# Patient Record
Sex: Male | Born: 1975 | Hispanic: No | Marital: Single | Smoking: Never smoker
Health system: Southern US, Community
[De-identification: ages and names within clinical notes are randomized; demographics above are authoritative.]

## PROBLEM LIST (undated history)

## (undated) DIAGNOSIS — B20 Human immunodeficiency virus [HIV] disease: Secondary | ICD-10-CM

---

## 2010-04-20 ENCOUNTER — Ambulatory Visit: Payer: Self-pay | Admitting: Adult Health

## 2010-04-20 DIAGNOSIS — K589 Irritable bowel syndrome without diarrhea: Secondary | ICD-10-CM

## 2010-04-20 DIAGNOSIS — D649 Anemia, unspecified: Secondary | ICD-10-CM

## 2010-04-20 DIAGNOSIS — B20 Human immunodeficiency virus [HIV] disease: Secondary | ICD-10-CM

## 2010-04-20 LAB — CONVERTED CEMR LAB
Alkaline Phosphatase: 77 units/L (ref 39–117)
BUN: 12 mg/dL (ref 6–23)
Bacteria, UA: NONE SEEN
Basophils Relative: 1 % (ref 0–1)
Bilirubin Urine: NEGATIVE
CO2: 25 meq/L (ref 19–32)
Creatinine, Ser: 1.07 mg/dL (ref 0.40–1.50)
Crystals: NONE SEEN
Eosinophils Absolute: 0.1 10*3/uL (ref 0.0–0.7)
Eosinophils Relative: 2 % (ref 0–5)
Glucose, Bld: 90 mg/dL (ref 70–99)
HCT: 35.7 % — ABNORMAL LOW (ref 39.0–52.0)
Hemoglobin: 11.8 g/dL — ABNORMAL LOW (ref 13.0–17.0)
Hep B S Ab: POSITIVE — AB
Hepatitis B Surface Ag: NEGATIVE
Ketones, ur: NEGATIVE mg/dL
LDL Cholesterol: 78 mg/dL (ref 0–99)
Lymphs Abs: 2.8 10*3/uL (ref 0.7–4.0)
MCHC: 33.1 g/dL (ref 30.0–36.0)
MCV: 87.1 fL (ref 78.0–100.0)
Monocytes Absolute: 0.6 10*3/uL (ref 0.1–1.0)
Monocytes Relative: 11 % (ref 3–12)
Neutrophils Relative %: 35 % — ABNORMAL LOW (ref 43–77)
Protein, ur: >300 mg/dL — AB
RBC: 4.1 M/uL — ABNORMAL LOW (ref 4.22–5.81)
Specific Gravity, Urine: 1.028 (ref 1.005–1.030)
Total Bilirubin: 0.3 mg/dL (ref 0.3–1.2)
Triglycerides: 106 mg/dL (ref <150)
Urine Glucose: NEGATIVE mg/dL
Urobilinogen, UA: 1 (ref 0.0–1.0)
VLDL: 21 mg/dL (ref 0–40)
WBC: 5.4 10*3/uL (ref 4.0–10.5)

## 2010-04-21 ENCOUNTER — Telehealth: Payer: Self-pay

## 2010-04-22 ENCOUNTER — Ambulatory Visit: Payer: Self-pay | Admitting: Adult Health

## 2010-04-22 DIAGNOSIS — R799 Abnormal finding of blood chemistry, unspecified: Secondary | ICD-10-CM

## 2010-04-22 DIAGNOSIS — R319 Hematuria, unspecified: Secondary | ICD-10-CM

## 2010-04-22 LAB — CONVERTED CEMR LAB
Albumin ELP: 29.3 % — ABNORMAL LOW (ref 55.8–66.1)
Alkaline Phosphatase: 76 units/L (ref 39–117)
Alpha-1-Globulin: 2.8 % — ABNORMAL LOW (ref 2.9–4.9)
BUN: 10 mg/dL (ref 6–23)
Beta Globulin: 4.6 % — ABNORMAL LOW (ref 4.7–7.2)
Bilirubin Urine: NEGATIVE
Gamma Globulin: 50.3 % — ABNORMAL HIGH (ref 11.1–18.8)
Glucose, Bld: 82 mg/dL (ref 70–99)
Leukocytes, UA: NEGATIVE
Sodium: 135 meq/L (ref 135–145)
Total Bilirubin: 0.3 mg/dL (ref 0.3–1.2)
Urine Glucose: NEGATIVE mg/dL
pH: 6 (ref 5.0–8.0)

## 2010-04-23 ENCOUNTER — Encounter: Payer: Self-pay | Admitting: Adult Health

## 2010-04-28 ENCOUNTER — Telehealth: Payer: Self-pay

## 2010-05-10 ENCOUNTER — Ambulatory Visit: Payer: Self-pay | Admitting: Adult Health

## 2010-05-10 DIAGNOSIS — F1910 Other psychoactive substance abuse, uncomplicated: Secondary | ICD-10-CM

## 2010-05-10 DIAGNOSIS — F341 Dysthymic disorder: Secondary | ICD-10-CM

## 2010-05-24 ENCOUNTER — Ambulatory Visit: Payer: Self-pay | Admitting: Adult Health

## 2010-05-24 ENCOUNTER — Encounter (INDEPENDENT_AMBULATORY_CARE_PROVIDER_SITE_OTHER): Payer: Self-pay | Admitting: *Deleted

## 2010-05-24 DIAGNOSIS — F528 Other sexual dysfunction not due to a substance or known physiological condition: Secondary | ICD-10-CM

## 2010-05-26 ENCOUNTER — Encounter (INDEPENDENT_AMBULATORY_CARE_PROVIDER_SITE_OTHER): Payer: Self-pay | Admitting: *Deleted

## 2010-06-08 ENCOUNTER — Ambulatory Visit: Payer: Self-pay | Admitting: Adult Health

## 2010-06-10 ENCOUNTER — Encounter: Payer: Self-pay | Admitting: Adult Health

## 2010-06-10 LAB — CONVERTED CEMR LAB
ALT: 14 units/L (ref 0–53)
BUN: 12 mg/dL (ref 6–23)
Bilirubin Urine: NEGATIVE
CO2: 26 meq/L (ref 19–32)
Calcium: 8.8 mg/dL (ref 8.4–10.5)
Chloride: 103 meq/L (ref 96–112)
Creatinine, Ser: 0.95 mg/dL (ref 0.40–1.50)
Crystals: NONE SEEN
Eosinophils Relative: 5 % (ref 0–5)
Glucose, Bld: 88 mg/dL (ref 70–99)
HCT: 33.8 % — ABNORMAL LOW (ref 39.0–52.0)
HIV 1 RNA Quant: 358 copies/mL — ABNORMAL HIGH (ref <20)
Hemoglobin: 11 g/dL — ABNORMAL LOW (ref 13.0–17.0)
Leukocytes, UA: NEGATIVE
Lymphocytes Relative: 46 % (ref 12–46)
Lymphs Abs: 2.1 10*3/uL (ref 0.7–4.0)
Nitrite: NEGATIVE
Platelets: 342 10*3/uL (ref 150–400)
Specific Gravity, Urine: 1.021 (ref 1.005–1.030)
Squamous Epithelial / LPF: NONE SEEN /lpf
Urobilinogen, UA: 0.2 (ref 0.0–1.0)
WBC: 4.5 10*3/uL (ref 4.0–10.5)

## 2010-06-22 ENCOUNTER — Encounter (INDEPENDENT_AMBULATORY_CARE_PROVIDER_SITE_OTHER): Payer: Self-pay | Admitting: *Deleted

## 2010-06-24 ENCOUNTER — Ambulatory Visit
Admission: RE | Admit: 2010-06-24 | Discharge: 2010-06-24 | Payer: Self-pay | Source: Home / Self Care | Attending: Adult Health | Admitting: Adult Health
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
## 2010-07-13 NOTE — Assessment & Plan Note (Signed)
Summary: Nurse Visit (Infectious Disease)   Infectious Disease New Patient Intake Referring MD/Agency: Laurette Schimke Address: [facility]  [address]  [place], [place] [address]  Return Appointment Date: 05/04/2010  With Physician: Traci Sermon, NP  Medical Records: Requested Health Insurance / Payor: No Insurance Employer: None    Does insurance cover prescriptions? Yes Our patient has been informed that medication assistance programs are available.  Our Co-ordinator will be meeting with the patient during this visit to discuss financial and medication assistance.   Do you have a Primary physician: No Are family members aware of patient's diagnosis?  If so, are they supportive? None,  Describe patient's current social support (family, friends, support groups): Support from friends who are also HIV Positive  Medical History Medication Allergies: No   Medications: No    Family History Hypertension:   Family Side: Maternal Diabetes:   Family Side: Maternal  Tobacco use: current Amt: 1/2 packs per day.  Behavioral Health Assessment Have you ever been diagnosed with depression or mental illness? Yes  Do you drink alcohol? Yes Alcohol Beverage Type(s): white wine  Frequency: 1-2 times weekly  Drugs: Past history of polysubstance abuse cocaine, crystal meth. sniffed chemicals  Do you feel you have a problem with drugs and/or alcohol? No   Describe: Past history noted Have you ever been in a treatment facility for any addiction? No Behavioral Health Comments: Pt was on drugs for 5 or more years   HIV Intake Information When did you first test positive for HIV? 11/23/2009 Type of test Conducted: Western Blot   Where was this test performed?  Name of Agency: [facility]  City/State: [place]  Was this your first time ever being tested or HIV? No   LAST negative HIV test result: 2-11 Name of Office: unknown    Risk Factor(s) for HIV: MSM  Method  of Exposure to HIV: Homosexual Intercourse-Insertive Have you ever been hospitalized for any HIV-related condition? No  Have you ever been under the care of a physician for being HIV positive? No  Newly Diagnosed Patients Has a Disease Intervention Specialist from the Health Department contacted the patient? Yes.   The patient has been informed that the [facility] Department will contact ALL newly reported cases. Health Department Contact:  [phone]   (SSN is needed for confirmation)  Reported Today: 04/20/2010 Health Department Contact:  [phone]            (SSN is needed for confirmation)  Person Reporting: previously reported /tkk Do you have any Non-HIV related medical conditions or other prior hospitalizations or surgeries? No  HIV Medications Information The patient is currently NOT taking any HIV medications.  Infection History  Patient has been diagnosed with the following opportunistic infections: Are there any other symptoms you need to discuss? No Have you received literature/education prior to this visit about HIV/AIDS? Yes Do you understand the meaning of a Viral Load? No Do you understand the meaning of a CD4 count? No Lab Values Education/Handout Given Yes Medication Education/Handout Given Yes  Sexual History Are you in a current relationship? No Details: Peviousl relationship  of 5 years recently ended. Partner was positive.  Are you currently sexually active? 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  Lifetime partners estimated at less than 50 with occasional condom use.   Last  6months 5 male partners anal and oral sex Last year 34 male partners  Evaluation and Follow-Up INTAKE CHECK LIST: HIV Education, Safe Sex Counseling, Case Management Referral, HIV Material Given  Prevention For Positives: 04/20/2010    Safe sex practices discussed with patient. Condoms offered. Are you involved in any social organization? [facility] Does patient have problems that warrant Social Worker referral? No  Immunization History:  Influenza Immunization History:    Influenza:  historical (11/25/2009)  Immunizations Administered:  Pneumonia Vaccine:    Vaccine Type: Pneumovax    Site: right deltoid    Mfr: Merck    Dose: 0.5 ml    Route: IM    Given by: Tomasita Morrow RN    Exp. Date: 10/13/2011    Lot #: [id number]    VIS given: 05/18/09 version given April 20, 2010.  PPD Results    Date of reading: 11/25/2009    Results: < 5mm    Interpretation: negative            Prevention For Positives: 04/20/2010   Safe sex practices discussed with patient. Condoms offered.

## 2010-07-13 NOTE — Progress Notes (Signed)
Summary: Headaches  Phone Note Call from Patient   Complaint: Breathing Problems Summary of Call: Pt his having headaches a few hours after taking the Bactrim.   Headaches started after starting the Bactrim.  Today he purposely did not take the med and he has not had the headache.  Please advise.   Initial call taken by: Tomasita Morrow RN,  April 28, 2010 4:11 PM  Follow-up for Phone Call        Pt states  the headaches stopped. Tomasita Morrow RN  May 05, 2010 5:40 PM

## 2010-07-13 NOTE — Miscellaneous (Signed)
Summary: HIPAA Restrictions  HIPAA Restrictions   Imported By: Florinda Marker 04/26/2010 16:05:32  _____________________________________________________________________  External Attachment:    Type:   Image     Comment:   External Document

## 2010-07-13 NOTE — Progress Notes (Signed)
Summary: Return for OV and repeat labs/tkk  Phone Note Outgoing Call   Call placed by: Tomasita Morrow RN,  April 21, 2010 3:18 PM Summary of Call: Patient called for return OV to repeat urinalysis and blood work per [employer].    Pt will see Abbie Sons  prior to labs. Tomasita Morrow RN  April 21, 2010 3:19 PM Appt. scheduled. Tomasita Morrow RN  April 21, 2010 3:19 PM

## 2010-07-15 NOTE — Miscellaneous (Signed)
Summary: ADAP update   Clinical Lists Changes  Observations: Added new observation of AIDSDAP: Yes 2011 (05/26/2010 9:56)
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
## 2010-07-15 NOTE — Miscellaneous (Signed)
Summary: RW Financial Update  Clinical Lists Changes  Observations: Added new observation of YEARLYEXPEN: 1148  (05/24/2010 12:14) Added new observation of RWTITLE: B  (05/24/2010 12:14) Added new observation of AIDSDAP: Pending - applied  (05/24/2010 12:14) Added new observation of PCTFPL: 21.73  (05/24/2010 12:14) Added new observation of INCOMESOURCE: worked thr [employer]   currently unemployed   (05/24/2010 12:14) Added new observation of HOUSEINCOME: 2353  (05/24/2010 12:14) Added new observation of #CHILD<18 IN: No  (05/24/2010 12:14) Added new observation of FAMILYSIZE: 1  (05/24/2010 12:14) Added new observation of HOUSING: Temporary  (05/24/2010 12:14) Added new observation of FINASSESSDT: 05/24/2010  (05/24/2010 12:14) Added new observation of RWPARTICIP: Yes  (05/24/2010 12:14)

## 2010-07-15 NOTE — Assessment & Plan Note (Signed)
Summary: 2WKS F/U [MKJ]   CC:  f/u.  History of Present Illness: Doing well, adherent with meds, no physical coimplaints.  Did not order refills as directed, but received call from [facility] requesting whether he needs refills.   Current Allergies (reviewed today): No known allergies  Review of Systems  The patient denies anorexia, fever, weight loss, weight gain, vision loss, decreased hearing, hoarseness, chest pain, syncope, dyspnea on exertion, peripheral edema, prolonged cough, headaches, hemoptysis, abdominal pain, melena, hematochezia, severe indigestion/heartburn, hematuria, incontinence, genital sores, muscle weakness, suspicious skin lesions, transient blindness, difficulty walking, depression, unusual weight change, abnormal bleeding, enlarged lymph nodes, angioedema, breast masses, and testicular masses.    Vital Signs:  Patient profile:   35 year old male Height:      71 inches (180.34 cm) Weight:      168.75 pounds (76.70 kg) BMI:     23.62 Temp:     97.9 degrees F (36.61 degrees C) oral Pulse rate:   65 / minute BP sitting:   110 / 75  (left arm)  Vitals Entered By: Starleen Arms CMA (June 24, 2010 9:42 AM) CC: f/u Is Patient Diabetic? No Pain Assessment Patient in pain? no      Nutritional Status BMI of 19 -24 = normal Nutritional Status Detail nl  Does patient need assistance? Functional Status Self care Ambulation Normal   Physical Exam  General:  Well-developed,well-nourished,in no acute distress; alert,appropriate and cooperative throughout examination Head:  Normocephalic and atraumatic without obvious abnormalities. No apparent alopecia or balding. Eyes:  No corneal or conjunctival inflammation noted. EOMI. Perrla. Vision grossly normal. Ears:  External ear exam shows no significant lesions or deformities.  Otoscopic examination reveals clear canals, tympanic membranes are intact bilaterally without bulging, retraction, inflammation or  discharge. Hearing is grossly normal bilaterally. Mouth:  Oral mucosa and oropharynx without lesions or exudates.  Teeth in good repair. Neck:  No deformities, masses, or tenderness noted. Lungs:  Normal respiratory effort, chest expands symmetrically. Lungs are clear to auscultation, no crackles or wheezes. Heart:  Normal rate and regular rhythm. S1 and S2 normal without gallop, murmur, click, rub or other extra sounds. Abdomen:  Bowel sounds positive,abdomen soft and non-tender without masses, organomegaly or hernias noted. Msk:  No deformity or scoliosis noted of thoracic or lumbar spine.   Pulses:  R and L carotid,radial,femoral,dorsalis pedis and posterior tibial pulses are full and equal bilaterally Extremities:  No clubbing, cyanosis, edema, or deformity noted with normal full range of motion of all joints.   Neurologic:  No cranial nerve deficits noted. Station and gait are normal. Sensory, motor and coordinative functions appear intact. Skin:  Intact without suspicious lesions or rashes Cervical Nodes:  Shoddy A&P Cx LN's Axillary Nodes:  Shoddy enlarged axillary LN's Psych:  Cognition and judgment appear intact. Alert and cooperative with normal attention span and concentration. No apparent delusions, illusions, hallucinations   Impression & Recommendations:  Problem # 1:  HIV INFECTION (ICD-042)  CD4 380 @ 17% which is up from 270 but an increase of 6% after 1 month therapy.  HIV RNA  is 358 copies/ml per PCR which is a 2.58 log drop from baseline of 134,000 copies.  Good nresponse after 1 month therapy.  Provided him with this encouraging news and he recommitted to adherence with his meds.  Confirmed he will follow-through with his refills today.  We will plan on repeat labs in 6 weeks with f/u in 8 weeks.  Acknowledged this and agreed with  plan of care. His updated medication list for this problem includes:    Smz-tmp Ds 800-160 Mg Tabs (Sulfamethoxazole-trimethoprim) .Marland Kitchen... 1  tablet by mouth once daily  Orders: Est. Patient Level III (99213)Future Orders: T-CBC w/Diff ([id number]) ... 08/05/2010 T-CD4SP ([facility]) (CD4SP) ... 08/05/2010 T-Comprehensive Metabolic Panel [id number]) ... 08/05/2010 T-HIV Viral Load [id number]) ... 08/05/2010  Problem # 2:  ERECTILE DYSFUNCTION, NON-ORGANIC (ICD-302.72) Assessment: Improved  His updated medication list for this problem includes:    Cialis 5 Mg Tabs (Tadalafil) .Marland Kitchen... 1 tablet by mouth once daily as directed  Problem # 3:  DYSTHYMIA, SITUATIONAL (ICD-300.4) Assessment: Improved

## 2010-07-15 NOTE — Miscellaneous (Signed)
  Clinical Lists Changes 

## 2010-07-15 NOTE — Assessment & Plan Note (Signed)
Summary: Office Visit - Infectious Disease   History of Present Illness: Returned for re-evaluation after starting PCP prophylaxis.  Relates during the holiday weekend he used crack cocaine and now is feeling severely depressed over diagnosis and his health.  Feels as though his life is "falling apart" and does not know if he can "survive" this.  In spite of his mood, he wants to start therapy for HIV.  Admits adherence to prophylaxis meds without any problems.  Denies rash or fever.   Current Allergies: No known allergies  Review of Systems General:  Complains of loss of appetite and sleep disorder; denies chills, fatigue, fever, malaise, sweats, weakness, and weight loss. ENT:  Denies decreased hearing, difficulty swallowing, ear discharge, earache, hoarseness, nasal congestion, nosebleeds, postnasal drainage, ringing in ears, sinus pressure, and sore throat. CV:  Denies bluish discoloration of lips or nails, chest pain or discomfort, difficulty breathing at night, difficulty breathing while lying down, fainting, fatigue, leg cramps with exertion, lightheadness, near fainting, palpitations, shortness of breath with exertion, swelling of feet, swelling of hands, and weight gain. Resp:  Denies chest discomfort, chest pain with inspiration, cough, coughing up blood, excessive snoring, hypersomnolence, morning headaches, pleuritic, shortness of breath, sputum productive, and wheezing. GI:  Complains of loss of appetite; denies abdominal pain, bloody stools, change in bowel habits, constipation, dark tarry stools, diarrhea, excessive appetite, gas, hemorrhoids, indigestion, nausea, vomiting, vomiting blood, and yellowish skin color. GU:  Complains of erectile dysfunction; denies decreased libido, discharge, dysuria, genital sores, hematuria, incontinence, nocturia, urinary frequency, and urinary hesitancy. MS:  Denies joint pain, joint redness, joint swelling, loss of strength, low back pain, mid back  pain, muscle aches, muscle , cramps, muscle weakness, stiffness, and thoracic pain. Derm:  Denies changes in color of skin, changes in nail beds, dryness, excessive perspiration, flushing, hair loss, insect bite(s), itching, lesion(s), poor wound healing, and rash. Neuro:  Denies brief paralysis, difficulty with concentration, disturbances in coordination, falling down, headaches, inability to speak, memory loss, numbness, poor balance, seizures, sensation of room spinning, tingling, tremors, visual disturbances, and weakness. Psych:  Complains of anxiety, depression, and easily tearful; denies alternate hallucination ( auditory/visual), easily angered, irritability, mental problems, panic attacks, sense of great danger, suicidal thoughts/plans, thoughts of violence, unusual visions or sounds, and thoughts /plans of harming others.  Physical Exam  General:  alert, well-developed, well-nourished, well-hydrated, disheveled, and unkempt.   Head:  Normocephalic and atraumatic without obvious abnormalities. No apparent alopecia or balding. Eyes:  No corneal or conjunctival inflammation noted. EOMI. Perrla. Ears:  External ear exam shows no significant lesions or deformities.  Otoscopic examination reveals clear canals, tympanic membranes are intact bilaterally without bulging, retraction, inflammation or discharge. Hearing is grossly normal bilaterally. Mouth:  Oral mucosa and oropharynx without lesions or exudates.  Teeth in good repair. Neck:  No deformities, masses, or tenderness noted. Lungs:  Normal respiratory effort, chest expands symmetrically. Lungs are clear to auscultation, no crackles or wheezes. Heart:  Tachycardic but regular rhythm. S1 and S2 normal without gallop, murmur, click, rub or other extra sounds.   Abdomen:  Bowel sounds positive,abdomen soft and non-tender without masses, organomegaly or hernias noted. Genitalia:  Testes bilaterally descended without nodularity, tenderness or  masses. No scrotal masses or lesions. No penis lesions or urethral discharge. Msk:  No deformity or scoliosis noted of thoracic or lumbar spine.   Pulses:  R and L carotid,radial,femoral,dorsalis pedis and posterior tibial pulses are full and equal bilaterally Extremities:  No clubbing, cyanosis,  edema, or deformity noted with normal full range of motion of all joints.   Neurologic:  No cranial nerve deficits noted. Station and gait are normal. Plantar reflexes are down-going bilaterally. DTRs are symmetrical throughout. Sensory, motor and coordinative functions appear intact. Skin:  Mild follicular rash on face, otherwise clear, no rashes or lesions Cervical Nodes:  Shoddy A&P Cx LN's as well as shoddy enlarged submandibular LN's bil. Axillary Nodes:  Shoddy enlarged axillary LN's Inguinal Nodes:  Shoddy enlarged bil. inguinal LN's Psych:  Oriented X3, memory intact for recent and remote, depressed affect, poor eye contact, tearful, and moderately anxious.     Impression & Recommendations:  Problem # 1:  HIV INFECTION (ICD-042) In spite of his current state, he still remains adamant on starting ARV therapy.  We discussed the variety of treaatment options available, specific drug SE's, ADR's, and toxicities.  He strongly desires starting Atripla therapy.  As his ADAP is not complete, we will dispense a 30-day supply to start, have him complete his ADAP requirements, and have him return in 2 weeks for follow-up.  His updated medication list for this problem includes:    Smz-tmp Ds 800-160 Mg Tabs (Sulfamethoxazole-trimethoprim) .Marland Kitchen... 1 tablet by mouth once daily  Problem # 2:  DYSTHYMIA, SITUATIONAL (ICD-300.4) Current emotional state may be associated with recent drug use.  We will have the [facility] counselor speak with him today.  This will require further monitoring, but hopefully this is transient.  No medicine intervention at present unless symptoms continue.  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  States current relapse was "stupid" and sees outcome of his drug use.  This will require ongoing monitoring for relapse.  Rehab should still be offered at each opportunity, especially if he cointinues t relapse.  Will re-address on f/u  Medications Added to Medication List This Visit: 1)  Atripla 600-200-300 Mg Tabs (Efavirenz-emtricitab-tenofovir) .... Take one (1) tablet once a day at bedtime on empty stomach Prescriptions: ATRIPLA 600-200-300 MG TABS (EFAVIRENZ-EMTRICITAB-TENOFOVIR) Take one (1) tablet once a day at bedtime on empty stomach  #30 x 5   Entered and Authorized by:   Talmadge Chad NP   Signed by:   Talmadge Chad NP on 05/24/2010   Method used:   Print then Give to Patient   RxID:   [id number]

## 2010-07-15 NOTE — Miscellaneous (Signed)
  Clinical Lists Changes   Orders: Added new Service order of Est. Patient Level III (99213) - Signed 

## 2010-07-15 NOTE — Assessment & Plan Note (Signed)
Summary: 2WK F/U [MKJ]   CC:  follow-up visit.  History of Present Illness: Feeling much better.  Adherent to ARV therapy without any missed doses.  Denies any neurologic symptoms, vivid dreams, or balance/coordination problems.  Took Atripla once with food, but did not have any untoward effects.  Has not taken any drugs since last visit.  Does have c/o erectile dysfunction and requesting Rx for ED.  Preventive Screening-Counseling & Management  Alcohol-Tobacco     Alcohol drinks/day: occasional     Smoking Status: never  Caffeine-Diet-Exercise     Caffeine use/day: coffee and diet soda 3 per week     Does Patient Exercise: no  Hep-HIV-STD-Contraception     HIV Risk: no risk noted  Safety-Violence-Falls     Seat Belt Use: yes  Review of Systems  The patient denies anorexia, fever, weight loss, weight gain, vision loss, decreased hearing, hoarseness, chest pain, syncope, dyspnea on exertion, peripheral edema, prolonged cough, headaches, hemoptysis, abdominal pain, melena, hematochezia, severe indigestion/heartburn, hematuria, incontinence, genital sores, muscle weakness, suspicious skin lesions, transient blindness, difficulty walking, depression, unusual weight change, abnormal bleeding, enlarged lymph nodes, angioedema, breast masses, and testicular masses.    Vital Signs:  Patient profile:   35 year old male Height:      71 inches (180.34 cm) Weight:      164.0 pounds (74.55 kg) BMI:     22.96 Temp:     97.9 degrees F (36.61 degrees C) oral Pulse rate:   68 / minute BP sitting:   142 / 92  (left arm) Cuff size:   regular  Vitals Entered By: Jennet Maduro RN (May 24, 2010 11:06 AM) CC: follow-up visit Is Patient Diabetic? No Pain Assessment Patient in pain? no      Nutritional Status BMI of 19 -24 = normal  Have you ever been in a relationship where you felt threatened, hurt or afraid?not asked todya   Does patient need assistance? Functional Status Self  care Ambulation Normal   Physical Exam  General:  Well-developed,well-nourished,in no acute distress; alert,appropriate and cooperative throughout examination Head:  Normocephalic and atraumatic without obvious abnormalities. No apparent alopecia or balding. Eyes:  No corneal or conjunctival inflammation noted. EOMI. Perrla. Ears:  External ear exam shows no significant lesions or deformities.  Otoscopic examination reveals clear canals, tympanic membranes are intact bilaterally without bulging, retraction, inflammation or discharge. Hearing is grossly normal bilaterally. Mouth:  Oral mucosa and oropharynx without lesions or exudates.  Teeth in good repair. Neck:  No deformities, masses, or tenderness noted. Lungs:  Normal respiratory effort, chest expands symmetrically. Lungs are clear to auscultation, no crackles or wheezes. Heart:  Normal rate and regular rhythm. S1 and S2 normal without gallop, murmur, click, rub or other extra sounds. Abdomen:  Bowel sounds positive,abdomen soft and non-tender without masses, organomegaly or hernias noted. Msk:  No deformity or scoliosis noted of thoracic or lumbar spine.   Extremities:  No clubbing, cyanosis, edema, or deformity noted with normal full range of motion of all joints.   Neurologic:  No cranial nerve deficits noted. Station and gait are normal. Plantar reflexes are down-going bilaterally. DTRs are symmetrical throughout. Sensory, motor and coordinative functions appear intact. Skin:  Intact without suspicious lesions or rashes Cervical Nodes:  Shoddy A&P Cx LN's as well as shoddy enlarged submandibular LN's bil. Axillary Nodes:  Shoddy enlarged axillary LN's Psych:  Cognition and judgment appear intact. Alert and cooperative with normal attention span and concentration. No apparent delusions, illusions,  hallucinations   Impression & Recommendations:  Problem # 1:  HIV INFECTION (ICD-042) Tolerating and adherent to ARV therapy and PCP  prophylaxis.  Will continue current regimen, have him return for labs in 2 weeks and f/u in 4 weeks. His updated medication list for this problem includes:    Smz-tmp Ds 800-160 Mg Tabs (Sulfamethoxazole-trimethoprim) .Marland Kitchen... 1 tablet by mouth once daily  Future Orders: T-CBC w/Diff ([id number]) ... 06/08/2010 T-CD4SP ([facility]) (CD4SP) ... 06/08/2010 T-Comprehensive Metabolic Panel [id number]) ... 06/08/2010 T-HIV Viral Load [id number]) ... 06/08/2010  Problem # 2:  DYSTHYMIA, SITUATIONAL (ICD-300.4) Much improved, appears well-engaged.  No vegetative signs noted.  Could have been residual effects of recent drug use.  Will continue to monitor.  Problem # 3:  DRUG ABUSE (ICD-305.90) No drug use for the past two weeks.  Mood apparently much better, but requires further monitoring and ongoing assessment.  He claims he does not need rehab program.  We will continue to offer himthis, especially if he relapses again.  Problem # 4:  ERECTILE DYSFUNCTION, NON-ORGANIC (ICD-302.72) Will give him trial of Cialis.  Instructed how to obtain on-line coupon from website.  Drug information regarding Cialis use provided as well.  Will f/u with response on RTC. His updated medication list for this problem includes:    Cialis 5 Mg Tabs (Tadalafil) .Marland Kitchen... 1 tablet by mouth once daily as directed  Medications Added to Medication List This Visit: 1)  Cialis 5 Mg Tabs (Tadalafil) .Marland Kitchen.. 1 tablet by mouth once daily as directed  Other Orders: Est. Patient Level III (57846) Future Orders: T-Urinalysis ([id number]) ... 06/08/2010 Prescriptions: CIALIS 5 MG TABS (TADALAFIL) 1 tablet by mouth once daily as directed  #30 x 0   Entered and Authorized by:   Talmadge Chad NP   Signed by:   Talmadge Chad NP on 05/24/2010   Method used:   Print then Give to Patient   RxID:   [id number]   Handout requested.

## 2010-07-15 NOTE — Assessment & Plan Note (Signed)
Summary: NEW 042/TKK   CC:  new patient and no appetite.  Preventive Screening-Counseling & Management  Alcohol-Tobacco     Alcohol drinks/day: occasional     Smoking Status: never     Packs/Day: 1/2   Current Allergies (reviewed today): No known allergies  Vital Signs:  Patient profile:   35 year old male Height:      71 inches (180.34 cm) Weight:      159 pounds (72.27 kg) BMI:     22.26 Temp:     97.6 degrees F (36.44 degrees C) oral BP sitting:   111 / 81  (left arm)  Vitals Entered By: Starleen Arms CMA (May 10, 2010 10:00 AM) CC: new patient, no appetite Is Patient Diabetic? No Pain Assessment Patient in pain? no      Nutritional Status nlBMI of 19 -24 = normal Nutritional Status Detail not eating well  Does patient need assistance? Functional Status Self care Ambulation Normal

## 2010-08-09 ENCOUNTER — Other Ambulatory Visit: Payer: Self-pay

## 2010-08-20 ENCOUNTER — Encounter: Payer: Self-pay | Admitting: Adult Health

## 2010-08-23 ENCOUNTER — Ambulatory Visit: Payer: Self-pay | Admitting: Adult Health

## 2010-08-23 LAB — T-HELPER CELL (CD4) - (RCID CLINIC ONLY): CD4 T Cell Abs: 380 uL — ABNORMAL LOW (ref 400–2700)

## 2010-08-24 LAB — T-HELPER CELL (CD4) - (RCID CLINIC ONLY): CD4 % Helper T Cell: 11 % — ABNORMAL LOW (ref 33–55)

## 2011-01-14 ENCOUNTER — Emergency Department (HOSPITAL_COMMUNITY): Payer: Self-pay

## 2011-01-14 ENCOUNTER — Inpatient Hospital Stay (HOSPITAL_COMMUNITY)
Admission: EM | Admit: 2011-01-14 | Discharge: 2011-01-17 | DRG: 313 | Disposition: A | Discharge status: Home / Self Care | Payer: Self-pay | Attending: Internal Medicine | Admitting: Internal Medicine

## 2011-01-14 DIAGNOSIS — F209 Schizophrenia, unspecified: Secondary | ICD-10-CM | POA: Diagnosis present

## 2011-01-14 DIAGNOSIS — R072 Precordial pain: Principal | ICD-10-CM | POA: Diagnosis present

## 2011-01-14 DIAGNOSIS — E860 Dehydration: Secondary | ICD-10-CM | POA: Diagnosis present

## 2011-01-14 DIAGNOSIS — N179 Acute kidney failure, unspecified: Secondary | ICD-10-CM | POA: Diagnosis present

## 2011-01-14 DIAGNOSIS — D649 Anemia, unspecified: Secondary | ICD-10-CM | POA: Diagnosis present

## 2011-01-14 DIAGNOSIS — Z79899 Other long term (current) drug therapy: Secondary | ICD-10-CM

## 2011-01-14 DIAGNOSIS — E876 Hypokalemia: Secondary | ICD-10-CM | POA: Diagnosis present

## 2011-01-14 DIAGNOSIS — F1410 Cocaine abuse, uncomplicated: Secondary | ICD-10-CM | POA: Diagnosis present

## 2011-01-14 DIAGNOSIS — B20 Human immunodeficiency virus [HIV] disease: Secondary | ICD-10-CM | POA: Diagnosis present

## 2011-01-14 DIAGNOSIS — R9431 Abnormal electrocardiogram [ECG] [EKG]: Secondary | ICD-10-CM | POA: Diagnosis present

## 2011-01-14 LAB — URINALYSIS, MICROSCOPIC ONLY
Glucose, UA: NEGATIVE mg/dL
Protein, ur: NEGATIVE mg/dL

## 2011-01-14 LAB — URINALYSIS, ROUTINE W REFLEX MICROSCOPIC
Nitrite: NEGATIVE
Protein, ur: 100 mg/dL — AB
Urobilinogen, UA: 1 mg/dL (ref 0.0–1.0)

## 2011-01-14 LAB — COMPREHENSIVE METABOLIC PANEL
ALT: 10 U/L (ref 0–53)
AST: 23 U/L (ref 0–37)
Alkaline Phosphatase: 69 U/L (ref 39–117)
CO2: 24 mEq/L (ref 19–32)
Calcium: 8.3 mg/dL — ABNORMAL LOW (ref 8.4–10.5)
Chloride: 106 mEq/L (ref 96–112)
GFR calc non Af Amer: >60 mL/min (ref >60)
Potassium: 4 mEq/L (ref 3.5–5.1)
Sodium: 135 mEq/L (ref 135–145)
Total Bilirubin: 0.7 mg/dL (ref 0.3–1.2)

## 2011-01-14 LAB — URINE MICROSCOPIC-ADD ON

## 2011-01-14 LAB — CK TOTAL AND CKMB (NOT AT ARMC)
CK, MB: 2.8 ng/mL (ref 0.3–4.0)
CK, MB: 3.3 ng/mL (ref 0.3–4.0)
Relative Index: 0.6 (ref 0.0–2.5)

## 2011-01-14 LAB — DIFFERENTIAL
Lymphocytes Relative: 22 % (ref 12–46)
Lymphs Abs: 1.8 10*3/uL (ref 0.7–4.0)
Monocytes Relative: 9 % (ref 3–12)
Neutrophils Relative %: 69 % (ref 43–77)

## 2011-01-14 LAB — BASIC METABOLIC PANEL
BUN: 23 mg/dL (ref 6–23)
CO2: 23 mEq/L (ref 19–32)
Calcium: 9.3 mg/dL (ref 8.4–10.5)
Chloride: 101 mEq/L (ref 96–112)
Creatinine, Ser: 1.65 mg/dL — ABNORMAL HIGH (ref 0.50–1.35)
Glucose, Bld: 93 mg/dL (ref 70–99)

## 2011-01-14 LAB — TROPONIN I: Troponin I: <0.3 ng/mL (ref <0.30)

## 2011-01-14 LAB — CBC
HCT: 34 % — ABNORMAL LOW (ref 39.0–52.0)
Hemoglobin: 11.3 g/dL — ABNORMAL LOW (ref 13.0–17.0)
MCH: 28.4 pg (ref 26.0–34.0)
MCV: 85.4 fL (ref 78.0–100.0)
RBC: 3.98 MIL/uL — ABNORMAL LOW (ref 4.22–5.81)
WBC: 8.5 10*3/uL (ref 4.0–10.5)

## 2011-01-14 LAB — APTT: aPTT: 37 seconds (ref 24–37)

## 2011-01-14 LAB — RAPID URINE DRUG SCREEN, HOSP PERFORMED
Benzodiazepines: NOT DETECTED
Cocaine: NOT DETECTED

## 2011-01-14 MED ORDER — IOHEXOL 300 MG/ML  SOLN
100.0000 mL | Freq: Once | INTRAMUSCULAR | Status: AC | PRN
  Administered 2011-01-14: 100 mL via INTRAVENOUS

## 2011-01-15 LAB — DIFFERENTIAL
Basophils Absolute: 0 10*3/uL (ref 0.0–0.1)
Lymphocytes Relative: 45 % (ref 12–46)
Neutro Abs: 2.1 10*3/uL (ref 1.7–7.7)
Neutrophils Relative %: 44 % (ref 43–77)

## 2011-01-15 LAB — CARDIAC PANEL(CRET KIN+CKTOT+MB+TROPI)
Relative Index: 0.5 (ref 0.0–2.5)
Troponin I: <0.3 ng/mL (ref <0.30)

## 2011-01-15 LAB — BASIC METABOLIC PANEL
BUN: 14 mg/dL (ref 6–23)
Chloride: 105 mEq/L (ref 96–112)
GFR calc non Af Amer: >60 mL/min (ref >60)
Glucose, Bld: 107 mg/dL — ABNORMAL HIGH (ref 70–99)
Potassium: 3.9 mEq/L (ref 3.5–5.1)

## 2011-01-15 LAB — CREATININE, URINE, RANDOM: Creatinine, Urine: 165.5 mg/dL

## 2011-01-15 LAB — LIPID PANEL
Cholesterol: 119 mg/dL (ref 0–200)
HDL: 38 mg/dL — ABNORMAL LOW (ref >39)
Total CHOL/HDL Ratio: 3.1 RATIO

## 2011-01-15 LAB — CBC
HCT: 28.8 % — ABNORMAL LOW (ref 39.0–52.0)
Hemoglobin: 9.6 g/dL — ABNORMAL LOW (ref 13.0–17.0)
RBC: 3.32 MIL/uL — ABNORMAL LOW (ref 4.22–5.81)
WBC: 4.8 10*3/uL (ref 4.0–10.5)

## 2011-01-16 ENCOUNTER — Ambulatory Visit (HOSPITAL_COMMUNITY)
Admit: 2011-01-16 | Discharge: 2011-01-16 | Disposition: A | Discharge status: Other Acute Inpatient Hospital | Payer: Self-pay | Source: Ambulatory Visit | Attending: Internal Medicine | Admitting: Internal Medicine

## 2011-01-16 ENCOUNTER — Encounter (HOSPITAL_COMMUNITY): Payer: Self-pay

## 2011-01-16 DIAGNOSIS — R079 Chest pain, unspecified: Secondary | ICD-10-CM | POA: Insufficient documentation

## 2011-01-16 LAB — BASIC METABOLIC PANEL
BUN: 7 mg/dL (ref 6–23)
CO2: 25 mEq/L (ref 19–32)
Chloride: 106 mEq/L (ref 96–112)
Creatinine, Ser: 0.77 mg/dL (ref 0.50–1.35)
Glucose, Bld: 89 mg/dL (ref 70–99)

## 2011-01-16 LAB — URINE CULTURE: Culture  Setup Time: 201208040213

## 2011-01-16 LAB — VITAMIN B12: Vitamin B-12: 406 pg/mL (ref 211–911)

## 2011-01-16 LAB — IRON AND TIBC
Iron: 82 ug/dL (ref 42–135)
TIBC: 232 ug/dL (ref 215–435)
UIBC: 150 ug/dL

## 2011-01-16 LAB — CBC
HCT: 30.2 % — ABNORMAL LOW (ref 39.0–52.0)
Hemoglobin: 9.7 g/dL — ABNORMAL LOW (ref 13.0–17.0)
MCV: 88.3 fL (ref 78.0–100.0)
RBC: 3.42 MIL/uL — ABNORMAL LOW (ref 4.22–5.81)
WBC: 4.2 10*3/uL (ref 4.0–10.5)

## 2011-01-16 MED ORDER — TECHNETIUM TC 99M TETROFOSMIN IV KIT
30.0000 | PACK | Freq: Once | INTRAVENOUS | Status: AC | PRN
  Administered 2011-01-16: 30 via INTRAVENOUS

## 2011-01-16 MED ORDER — TECHNETIUM TC 99M TETROFOSMIN IV KIT
10.0000 | PACK | Freq: Once | INTRAVENOUS | Status: AC | PRN
  Administered 2011-01-16: 10 via INTRAVENOUS

## 2011-01-17 LAB — BASIC METABOLIC PANEL
Calcium: 8.8 mg/dL (ref 8.4–10.5)
Chloride: 103 mEq/L (ref 96–112)
Creatinine, Ser: 0.91 mg/dL (ref 0.50–1.35)
GFR calc Af Amer: >60 mL/min (ref >60)

## 2011-01-17 LAB — T-HELPER CELLS (CD4) COUNT (NOT AT ARMC): CD4 T Cell Abs: 550 uL (ref 400–2700)

## 2011-01-17 LAB — CBC
MCV: 88.6 fL (ref 78.0–100.0)
Platelets: 267 10*3/uL (ref 150–400)
RDW: 13.7 % (ref 11.5–15.5)
WBC: 4.4 10*3/uL (ref 4.0–10.5)

## 2011-02-16 ENCOUNTER — Inpatient Hospital Stay (HOSPITAL_COMMUNITY): Admit: 2011-02-16 | Payer: Self-pay

## 2011-02-17 ENCOUNTER — Other Ambulatory Visit (HOSPITAL_COMMUNITY): Payer: Self-pay

## 2011-02-17 NOTE — Discharge Summary (Signed)
George Simon, George Simon               ACCOUNT NO.:  [id number]  MEDICAL RECORD NO.:  [id number]  LOCATION:  1444                         FACILITY:  [facility]  PHYSICIAN:  Ramiro Harvest, MD    DATE OF BIRTH:  03-Feb-1976  DATE OF ADMISSION:  01/14/2011 DATE OF DISCHARGE:  01/17/2011                        DISCHARGE SUMMARY    The patient's ID doctor is Dr. Nedra Hai at [facility].  DISCHARGE DIAGNOSES: 1. Chest pain/abnormal EKG, resolved. 2. Dehydration, resolved. 3. Acute renal failure, resolved. 4. Hypokalemia, repleted. 5. History of HIV with CD4 count pending. 6. History of schizophrenia. 7. Normocytic anemia.  DISCHARGE MEDICATIONS: 1. Etravirine 200 mg 2 tablets p.o. daily. 2. Norvir 100 mg p.o. daily. 3. Prezista 400 mg p.o. b.i.d. 4. Truvada 1 tablet p.o. daily.  DISPOSITION AND FOLLOWUP:  The patient will be discharged home.  The patient is to follow up with his PCP, which will be arranged per case manager in 1 to 2 weeks.  The patient is also to follow up with his ID doctor as previously scheduled.  CONSULTATIONS DONE:  A cardiology consultation was done.  The patient was seen in consultation by Dr. Herbie [place] of [facility] on January 14, 2011.  PROCEDURES PERFORMED: 1. Chest x-ray was done on January 14, 2011 that showed no acute     cardiopulmonary disease.  CT angiogram of the chest done, January 14, 2011, shows no evidence of PE.  A 2-D echo was done on January 16, 2011 that showed that overall evaluation revealed fairly normal     study within technical limits.  Chamber sizes were difficult to     assess, but measured, LA size was calculated to be mildly dilated.     Right atrium and right ventricular chambers appeared visually     enlarged, but may be overestimated.  Left ventricular thickness     does appear to be increased with normal chamber sizes noted.  LV     function suggest normal function, normal wall motion as well, EF of     55% to  60%.  A stress Myoview was done on January 16, 2011 with     preliminary results negative per cardiologist.  Formal results are     currently pending and will be followed up upon per PCP.  BRIEF ADMISSION HISTORY AND PHYSICAL:  [name] is a 35 year old African American gentleman with history of HIV.  Last CD4 count per the patient between 200 to 300.  The patient is on heart therapy, which was recently changed secondary to increased resistance.  The patient does also have a history of schizophrenia, who presented to the ED with chest pain.  The patient stated that he was walking to [facility] and started to feel dehydrated with a dry mouth, subsequently went into [employer] to get a drink.  He got the drink and sat down.  Eventually, he stood up and just did not feel good and had some lightheadedness and a presyncopal episode with some shortness of breath, palpitations, and diaphoresis.  The patient did experience some midsternal nonradiating chest pain, described as not sharp in nature and lasting  several minutes.  A lady at [facility] who was working at [employer] subsequently was nice to him, had him sit down, called EMS and he was brought to the ED. The patient did endorse some wheezing as well.  Denied any nausea or vomiting.  No fever, no chills, no abdominal pain, no cough, no diarrhea, no constipation.  No dysuria, no change in his chronic weakness.  First set of cardiac enzymes, which were ordered in the ED were negative.  EKG did show T-wave inversions in leads I, II, III, aVF, and V4 through V6.  The patient is currently chest pain free.  In the ED, he was given some aspirin.  We were called to admit the patient for further evaluation and management.  Per ED, PA Cardiology, [facility] and Vascular had been consulted and will see the patient.  For the rest of the admission history and physical, please see H and P dictated per Dr. Janee Morn of job [phone].  HOSPITAL  COURSE: 1. Chest pain/abnormal EKG.  The patient was admitted with chest pain.     EKG, which was done was abnormal with T-wave inversions in leads I,     II, III, aVF, V4 through V6.  The patient had no family history of     premature coronary artery disease.  He was admitted for chest pain,     rule out cardiac enzymes, which were cycled were negative x3.  A D-     dimer, which was obtained was slightly elevated at 0.5 and as such     a CT angiogram of the chest was done, which was negative for PE.     TSH which was obtained was within normal limits at 1.102.  The     patient was seen by Cardiology on January 14, 2011 and due to his EKG     changes, it was felt that the patient will benefit from a stress     test during this hospitalization.  The patient had a 2-D echo with     results as stated above, which had a normal EF with no wall motion     abnormalities.  A Myoview stress test was obtained on January 16, 2011 and preliminary results per cardiologist was negative.  The     patient remained chest pain free for the rest of the     hospitalization.  The patient will subsequently be discharged home     in stable and improved condition, to follow up with PCP and his ID     doctor as stated above.  On the day of discharge, vital signs,     temperature 98.5, pulse of 59, respirations 20, blood pressure     126/85, and saturating 100% on room air.  Hemoglobin A1c, which was     obtained came back at 5.1.  A fasting lipid panel, which was     obtained did have a total cholesterol of 119, triglycerides of 39,     HDL of 38, and LDL of 73. 2. Dehydration.  On admission, the patient was noted to be dehydrated.     The patient was hydrated with IV fluids and was euvolemic by day of     discharge. 3. Normocytic anemia.  During the hospitalization and admission, the     patient was noted to be anemic.  The patient did not have any overt     GI bleed.  His hemoglobin remained stable.  Anemia  panel, which was     obtained was consistent with normocytic anemia.  DISCHARGE LABORATORY DATA:  Sodium 137, potassium 3.6, chloride 103, bicarbonate 30, glucose 83, BUN 10, creatinine 0.91, and calcium of 8.8. CBC with a white count of 4.4, hemoglobin 10.6, hematocrit 32.6, and a platelet count of 267.  It was a pleasure taking care of Mr. Rex Oesterle.     Ramiro Harvest, MD     DT/MEDQ  D:  01/17/2011  T:  01/17/2011  Job:  [id number]  cc:   Dr. Nedra Hai, [place].  Landry Corporal, MD Fax: [phone]  Electronically Signed by Ramiro Harvest MD on 02/17/2011 12:07:19 PM

## 2011-02-17 NOTE — H&P (Signed)
George Simon               ACCOUNT NO.:  [id number]  MEDICAL RECORD NO.:  [id number]  LOCATION:  WLED                         FACILITY:  [facility]  PHYSICIAN:  Ramiro Harvest, MD    DATE OF BIRTH:  [birth year]  DATE OF ADMISSION:  01/14/2011 DATE OF DISCHARGE:                             HISTORY & PHYSICAL   CHIEF COMPLAINT:  Chest pain.  PRIMARY CARE PHYSICIAN:  Unassigned.  INFECTIOUS DISEASE DOCTOR:  Dr. Nedra Hai at [facility].  HISTORY OF PRESENT ILLNESS:  George Simon is a 35 year old African American gentleman with history of HIV.  Per patient, last CD-4 count was between 200 to 300.  He is on HAART therapy, which was recently changed secondary to increased resistance, history of schizophrenia, presented to the ED with chest pain.  The patient stated that he was walking to [employer] and started to feel dehydrated with dry mouth.  He subsequently went into [employer] to get a drink, got the drink and sat down.  Eventually, he stood up and just did not feel good, had some lightheadedness and a presyncopal episode with some shortness of breath and palpitations and diaphoresis.  The patient did experience some midsternal nonradiating chest pain described as a knot and sharp and lasting several minutes.  Lady at [employer] who was working at [employer] subsequently was nice to him, had him sit down, and called EMS and the patient was subsequently brought to the ED.  The patient does endorse some wheezing as well.  The patient denied any nausea or vomiting.  No fever, no chills, no abdominal pain, no cough, no diarrhea, no constipation, no dysuria, no change in his chronic weakness.  The patient was seen in the ED.  First set of cardiac enzymes, which ordered were negative.  EKG did show some abnormalities with T-wave inversions in leads I, II, III, aVF, V4 through V6.  The patient currently chest painfree, was given an aspirin, we were called to admit the patient for further evaluation and  management.  Per ED PA, Cardiology, [facility] has also been consulted and will see the patient.  ALLERGIES:  No known drug allergies.  PAST MEDICAL HISTORY: 1. History of HIV diagnosed in June 2012. 2. History of schizophrenia.  SOCIAL HISTORY:  No tobacco use.  Occasional binge alcohol use.  Some recreational drug use of cocaine, last use about 6 months ago.  FAMILY HISTORY:  Mother alive age 65 with a history of schizophrenia, diabetes, hypertension, hyperlipidemia.  Father alive age 22 on his third bout of colon cancer.  Also history of bronchitis and a history of hepatitis C.  REVIEW OF SYSTEMS:  As per HPI, otherwise negative.  PHYSICAL EXAMINATION:  VITAL SIGNS: Temperature 98.6, blood pressure 122/79, pulse of 98, respirations 16, saturating 100% on room air. GENERAL: The patient is well-developed, well-nourished gentleman, in no acute cardiopulmonary distress. HEENT: Normocephalic, atraumatic.  Pupils equal, round, reactive to light and accommodation.  Extraocular movements intact.  Oropharynx is clear.  No lesions, no exudates. NECK: Supple.  No lymphadenopathy. RESPIRATORY:  Lungs are clear to auscultation bilaterally.  No wheezes, no crackles, no rhonchi. CARDIOVASCULAR: Regular rhythm.  No murmurs, rubs, or gallops.  ABDOMEN: Soft, nontender, nondistended.  Positive bowel sounds. EXTREMITIES: No clubbing, cyanosis, or edema. NEUROLOGIC: The patient is alert and oriented x3.  Cranial nerves II-XII are grossly intact with no focal deficits.  ADMISSION LABORATORY DATA:  A UDS was positive for amphetamines, otherwise was negative.  UA was amber, cloudy, specific gravity 1.026, pH of 5.5, glucose negative, bilirubin moderate, ketones 40, blood large, protein 100, urobilinogen 1.0, nitrite negative, leukocytes small.  Urine microscopy; WBCs 11 to 20, RBCs 3 to 6, bacteria is many. First set of cardiac enzymes; CK 452, CK-MB 2.8, troponin-I of less  than 0.30.  BMET with a sodium of 134, potassium 3.1, chloride 101, bicarb 23, glucose 93, BUN 23, creatinine 1.65, and a calcium of 9.3.  CBC with a white count of 8.5, hemoglobin 11.3, hematocrit 34.0, platelet count of 298 with an ANC of 5.8.  A chest x-ray shows no acute cardiopulmonary disease.  EKG is showing T-wave inversions in leads I, II, III, aVF, V4 through V6.  ASSESSMENT AND PLAN:  [name] is a 35 year old gentleman with history of human immunodeficiency virus and schizophrenia, presented to the ED with shortness of breath and chest pain and feeling lightheadedness. 1. Chest pain/shortness of breath, questionable etiology concerning     for acute coronary syndrome.  The patient; however, has minimal     risk factors, has a history of human immunodeficiency virus, also     history of substance abuse of cocaine, no family history of     premature coronary artery disease.  No tobacco abuse.  EKG;     however, is concerning with T-wave inversions in leads I, II, III,     aVF, V4 through V6.  We will admit the patient to Telemetry.  Cycle     cardiac enzymes q.8 h x3.  Serial EKGs.  Check a 2-D echo to rule     out left ventricular dysfunction.  Check a TSH.  Check a D-dimer.     Check a fasting lipid panel.  We will place on morphine, aspirin,     oxygen, nitroglycerin, and low-dose beta-blocker.  Cardiology     consult is pending for further evaluation and recommendations.  We     will also place on prophylactic dose Lovenox for now until the     patient is evaluated by Cardiology. 2. Dehydration.  Hydrate with IV fluids. 3. Acute renal failure likely secondary to a prerenal azotemia     secondary to dehydration.  We will check a fractional excretion of     sodium.  Check orthostatics.  Repeat UA with cultures and     sensitivities.  Place on IV fluids and follow.  If no improvement     in renal function, we will check a renal ultrasound. 4. Hypokalemia.  Check a  magnesium level and replete. 5. History of human immunodeficiency virus.  Check a CD-4 count.     Resume home regimen of HAART therapy. 6. History of schizophrenia. 7. Prophylaxis.  Lovenox for DVT prophylaxis.  It has been a pleasure taking care of [name].     Ramiro Harvest, MD     DT/MEDQ  D:  01/14/2011  T:  01/14/2011  Job:  [id number]  cc:   Dr. Heide Guile  Electronically Signed by Ramiro Harvest MD on 02/17/2011 12:07:05 PM

## 2012-02-06 ENCOUNTER — Encounter (INDEPENDENT_AMBULATORY_CARE_PROVIDER_SITE_OTHER): Payer: Self-pay

## 2012-02-06 DIAGNOSIS — Z113 Encounter for screening for infections with a predominantly sexual mode of transmission: Secondary | ICD-10-CM

## 2012-02-06 NOTE — Progress Notes (Signed)
Pt requesting return to [facility] for treatment of HIV.  He was a previous patient of Traci Sermon, NP in 2011. Records have been received form [facility] from 2011-2013.     Appointment scheduled for labs and office visit.  Orders entered.   Amy P. Case Manager for [facility] is working with transitioning this patient.   She was given appointment information and was told pt would need to see Britta Mccreedy for financial eligibility prior to lab appointment.     Laurell Josephs, RN

## 2012-02-08 ENCOUNTER — Other Ambulatory Visit: Payer: Self-pay | Admitting: Internal Medicine

## 2012-02-08 DIAGNOSIS — Z113 Encounter for screening for infections with a predominantly sexual mode of transmission: Secondary | ICD-10-CM

## 2012-02-08 DIAGNOSIS — B20 Human immunodeficiency virus [HIV] disease: Secondary | ICD-10-CM

## 2012-02-08 DIAGNOSIS — Z79899 Other long term (current) drug therapy: Secondary | ICD-10-CM

## 2012-02-16 ENCOUNTER — Ambulatory Visit: Payer: Self-pay

## 2012-02-16 ENCOUNTER — Other Ambulatory Visit: Payer: Self-pay

## 2012-02-16 DIAGNOSIS — B20 Human immunodeficiency virus [HIV] disease: Secondary | ICD-10-CM

## 2012-02-16 LAB — CBC WITH DIFFERENTIAL/PLATELET
Basophils Absolute: 0.1 10*3/uL (ref 0.0–0.1)
Basophils Relative: 1 % (ref 0–1)
Eosinophils Absolute: 0.1 10*3/uL (ref 0.0–0.7)
MCH: 27.6 pg (ref 26.0–34.0)
MCHC: 32.5 g/dL (ref 30.0–36.0)
Neutrophils Relative %: 25 % — ABNORMAL LOW (ref 43–77)
Platelets: 301 10*3/uL (ref 150–400)
RBC: 4.5 MIL/uL (ref 4.22–5.81)
RDW: 14 % (ref 11.5–15.5)

## 2012-02-16 LAB — URINALYSIS
Glucose, UA: NEGATIVE mg/dL
Ketones, ur: NEGATIVE mg/dL
Leukocytes, UA: NEGATIVE
Nitrite: NEGATIVE
Protein, ur: NEGATIVE mg/dL

## 2012-02-16 LAB — HEPATITIS B SURFACE ANTIBODY,QUALITATIVE: Hep B S Ab: POSITIVE — AB

## 2012-02-16 LAB — COMPLETE METABOLIC PANEL WITH GFR
ALT: 12 U/L (ref 0–53)
Alkaline Phosphatase: 78 U/L (ref 39–117)
Sodium: 136 mEq/L (ref 135–145)
Total Bilirubin: 0.6 mg/dL (ref 0.3–1.2)
Total Protein: 9.8 g/dL — ABNORMAL HIGH (ref 6.0–8.3)

## 2012-02-16 LAB — LIPID PANEL
LDL Cholesterol: 88 mg/dL (ref 0–99)
Total CHOL/HDL Ratio: 3.7 Ratio

## 2012-02-16 LAB — HEPATITIS B SURFACE ANTIGEN: Hepatitis B Surface Ag: NEGATIVE

## 2012-02-17 LAB — T-HELPER CELL (CD4) - (RCID CLINIC ONLY): CD4 % Helper T Cell: 14 % — ABNORMAL LOW (ref 33–55)

## 2012-02-17 LAB — HEPATITIS A ANTIBODY, TOTAL: Hep A Total Ab: NEGATIVE

## 2012-02-17 LAB — HEPATITIS B CORE ANTIBODY, TOTAL: Hep B Core Total Ab: NEGATIVE

## 2012-02-20 LAB — HIV-1 RNA QUANT-NO REFLEX-BLD
HIV 1 RNA Quant: 172195 copies/mL — ABNORMAL HIGH (ref <20)
HIV-1 RNA Quant, Log: 5.24 {Log} — ABNORMAL HIGH (ref <1.30)

## 2012-02-22 ENCOUNTER — Other Ambulatory Visit: Payer: Self-pay | Admitting: *Deleted

## 2012-03-01 ENCOUNTER — Encounter: Payer: Self-pay | Admitting: Internal Medicine

## 2012-03-01 ENCOUNTER — Ambulatory Visit (INDEPENDENT_AMBULATORY_CARE_PROVIDER_SITE_OTHER): Payer: Self-pay | Admitting: Internal Medicine

## 2012-03-01 VITALS — BP 129/84 | HR 82 | Temp 98.1°F | Ht 71.0 in | Wt 185.0 lb

## 2012-03-01 DIAGNOSIS — Z23 Encounter for immunization: Secondary | ICD-10-CM

## 2012-03-01 DIAGNOSIS — D649 Anemia, unspecified: Secondary | ICD-10-CM

## 2012-03-01 DIAGNOSIS — R319 Hematuria, unspecified: Secondary | ICD-10-CM

## 2012-03-01 DIAGNOSIS — B20 Human immunodeficiency virus [HIV] disease: Secondary | ICD-10-CM

## 2012-03-01 MED ORDER — ELVITEG-COBIC-EMTRICIT-TENOFDF 150-150-200-300 MG PO TABS: 1.0000 | ORAL_TABLET | Freq: Every day | ORAL | Status: DC

## 2012-03-05 ENCOUNTER — Telehealth: Payer: Self-pay | Admitting: *Deleted

## 2012-03-05 ENCOUNTER — Ambulatory Visit: Payer: Self-pay

## 2012-03-05 NOTE — Telephone Encounter (Signed)
This patient does not have a paper chart, and I can not find a genotype that has been scanned. Wendall Mola CMA

## 2012-03-05 NOTE — Assessment & Plan Note (Signed)
Improved from previous, likely with ARV therapy.  Will monitor.

## 2012-03-05 NOTE — Telephone Encounter (Signed)
Message copied by Macy Mis on Mon Mar 05, 2012  8:57 AM ------      Message from: Gardiner Barefoot      Created: Mon Mar 05, 2012  8:27 AM       Do we have records from [facility] on this patient?  In review of his record, it appears he has resistance noted at [facility].  Can you find a resistance genotpe from [facility]?  Thanks

## 2012-03-05 NOTE — Assessment & Plan Note (Signed)
Still some trace hematuria in urine. Will monitor.  If persistent, will consider urology referral.

## 2012-03-05 NOTE — Telephone Encounter (Signed)
I received genotype form [facility] dated 11/23/10. Wendall Mola CMA

## 2012-03-05 NOTE — Assessment & Plan Note (Addendum)
I will start him on Stribild, no record of any genotype resistance but in review of his hospital record I do note he was previously on 4 drug therapy suggesting resistance.  I will await record from [facility] and will add norvir and Prezista to his Stribild if he indeed has resistance.  RTCV 6 weeks.   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  He will need to change his regimen.

## 2012-03-05 NOTE — Progress Notes (Signed)
  Subjective:    Patient ID: George Simon, male    DOB: 1976-05-19, 35 y.o.   MRN: [id number]  HPI He comes in to reestablish care from [facility].  He currently is not taking medicine and most recently tells me he was on 3 pills, likely a PI, norvir and Truvada.  He also has a sore throat but no other issues.  Remote history of GC/chlamydia, no HSV to his knowledge.  Takes no other medication.  Does not know of any history of resistance.     Review of Systems  Constitutional: Negative for fever, chills, fatigue and unexpected weight change.  HENT: Negative for sore throat and trouble swallowing.   Respiratory: Negative for cough and shortness of breath.   Cardiovascular: Negative for chest pain, palpitations and leg swelling.  Gastrointestinal: Negative for nausea, abdominal pain, diarrhea and constipation.  Musculoskeletal: Negative for myalgias, joint swelling and arthralgias.  Skin: Negative for rash.  Neurological: Negative for dizziness and headaches.  Hematological: Negative for adenopathy.       Objective:   Physical Exam  Constitutional: He appears well-developed and well-nourished. No distress.  HENT:  Mouth/Throat: Oropharynx is clear and moist. No oropharyngeal exudate.  Cardiovascular: Normal rate, regular rhythm and normal heart sounds.  Exam reveals no gallop and no friction rub.   No murmur heard. Pulmonary/Chest: Effort normal and breath sounds normal. No respiratory distress. He has no wheezes.  Abdominal: Soft. Bowel sounds are normal. He exhibits no distension. There is no tenderness.  Genitourinary: Penis normal. No penile tenderness.  Lymphadenopathy:    He has no cervical adenopathy.  Skin: Skin is warm and dry. No rash noted.          Assessment & Plan:

## 2012-03-06 ENCOUNTER — Telehealth: Payer: Self-pay | Admitting: *Deleted

## 2012-03-06 MED ORDER — RITONAVIR 100 MG PO TABS: 100.0000 mg | ORAL_TABLET | Freq: Every day | ORAL | Status: DC

## 2012-03-06 MED ORDER — DOLUTEGRAVIR SODIUM 50 MG PO TABS: 50.0000 mg | ORAL_TABLET | Freq: Every day | ORAL | Status: DC

## 2012-03-06 MED ORDER — DARUNAVIR ETHANOLATE 800 MG PO TABS: 800.0000 mg | ORAL_TABLET | Freq: Every day | ORAL | Status: DC

## 2012-03-06 MED ORDER — EMTRICITABINE-TENOFOVIR DF 200-300 MG PO TABS: 1.0000 | ORAL_TABLET | Freq: Every day | ORAL | Status: DC

## 2012-03-06 NOTE — Addendum Note (Signed)
Addended by: Gardiner Barefoot on: 03/06/2012 01:03 PM   Modules accepted: Orders

## 2012-03-06 NOTE — Telephone Encounter (Signed)
Called patient and left voice mail, Dr. Luciana Axe changed his regimen due to resistance. Need to know what pharmacy to send meds to. Wendall Mola CMA

## 2012-03-06 NOTE — Telephone Encounter (Signed)
Message copied by Macy Mis on Tue Mar 06, 2012  4:42 PM ------      Message from: Gardiner Barefoot      Created: Tue Mar 06, 2012 12:57 PM       He will not be able to take Stribild due to resistance.  I don't think he has had it filled yet.  He will need to be on Truvada, darunavir, norvir and dolutegravir.  Let the patient know and if he wants to return and discuss it before starting he can return.  OK to overbook.      Thanks

## 2012-03-07 ENCOUNTER — Other Ambulatory Visit: Payer: Self-pay | Admitting: *Deleted

## 2012-03-07 DIAGNOSIS — B20 Human immunodeficiency virus [HIV] disease: Secondary | ICD-10-CM

## 2012-03-07 MED ORDER — RITONAVIR 100 MG PO TABS: 100.0000 mg | ORAL_TABLET | Freq: Every day | ORAL | Status: DC

## 2012-03-07 MED ORDER — EMTRICITABINE-TENOFOVIR DF 200-300 MG PO TABS: 1.0000 | ORAL_TABLET | Freq: Every day | ORAL | Status: DC

## 2012-03-07 MED ORDER — DOLUTEGRAVIR SODIUM 50 MG PO TABS: 50.0000 mg | ORAL_TABLET | Freq: Every day | ORAL | Status: DC

## 2012-03-07 MED ORDER — DARUNAVIR ETHANOLATE 800 MG PO TABS: 800.0000 mg | ORAL_TABLET | Freq: Every day | ORAL | Status: DC

## 2012-03-12 ENCOUNTER — Other Ambulatory Visit: Payer: Self-pay | Admitting: *Deleted

## 2012-03-12 DIAGNOSIS — B20 Human immunodeficiency virus [HIV] disease: Secondary | ICD-10-CM

## 2012-03-12 MED ORDER — EMTRICITABINE-TENOFOVIR DF 200-300 MG PO TABS: 1.0000 | ORAL_TABLET | Freq: Every day | ORAL | Status: DC

## 2012-03-12 MED ORDER — RITONAVIR 100 MG PO TABS: 100.0000 mg | ORAL_TABLET | Freq: Every day | ORAL | Status: DC

## 2012-03-12 MED ORDER — DOLUTEGRAVIR SODIUM 50 MG PO TABS: 50.0000 mg | ORAL_TABLET | Freq: Every day | ORAL | Status: DC

## 2012-03-12 MED ORDER — DARUNAVIR ETHANOLATE 800 MG PO TABS: 800.0000 mg | ORAL_TABLET | Freq: Every day | ORAL | Status: DC

## 2012-04-16 ENCOUNTER — Other Ambulatory Visit: Payer: Self-pay

## 2012-04-19 ENCOUNTER — Other Ambulatory Visit: Payer: Self-pay

## 2012-05-03 ENCOUNTER — Other Ambulatory Visit: Payer: Self-pay

## 2012-05-03 ENCOUNTER — Encounter: Payer: Self-pay | Admitting: Internal Medicine

## 2012-05-03 ENCOUNTER — Ambulatory Visit (INDEPENDENT_AMBULATORY_CARE_PROVIDER_SITE_OTHER): Payer: Self-pay | Admitting: Internal Medicine

## 2012-05-03 VITALS — BP 121/77 | HR 60 | Temp 98.1°F | Ht 72.0 in | Wt 178.0 lb

## 2012-05-03 DIAGNOSIS — B20 Human immunodeficiency virus [HIV] disease: Secondary | ICD-10-CM

## 2012-05-03 NOTE — Progress Notes (Signed)
  Subjective:    Patient ID: George Simon, male    DOB: 1975/06/14, 36 y.o.   MRN: [id number]  HPI He comes in for followup of his HIV. He previously was at [facility] for his care but has transferred his care here. At his initial visit, he told me that he did not know of any previous history of any resistance mutations and was unsure of what medications he had taken. I had initially then decided to start him on Stribild however I subsequently received the record from [facility] and it did show significant mutations including a 184 and I therefore changed his regimen to dolutegravir, Truvada, Prezista and Norvir. He started the medications however he ran out early and suspects that his partner took his medications. He therefore had about a 10 day span off of medications however has been on them again for about 7 days. He has had good tolerance of the medications and no new issues.   Review of Systems  Constitutional: Negative for fever, chills and fatigue.  HENT: Negative for sore throat and trouble swallowing.   Respiratory: Negative for cough and shortness of breath.   Cardiovascular: Negative for palpitations and leg swelling.  Gastrointestinal: Negative for nausea, abdominal pain and diarrhea.  Musculoskeletal: Negative for myalgias, joint swelling and arthralgias.  Skin: Positive for rash.  Neurological: Negative for headaches.       Objective:   Physical Exam  Constitutional: He appears well-developed and well-nourished. No distress.  HENT:  Mouth/Throat: Oropharynx is clear and moist. No oropharyngeal exudate.  Cardiovascular: Normal rate, regular rhythm and normal heart sounds.  Exam reveals no gallop and no friction rub.   No murmur heard. Pulmonary/Chest: Effort normal and breath sounds normal. No respiratory distress. He has no wheezes. He has no rales.  Abdominal: Soft. Bowel sounds are normal. He exhibits no distension. There is no tenderness.  Lymphadenopathy:    He has no cervical  adenopathy.          Assessment & Plan:

## 2012-05-03 NOTE — Assessment & Plan Note (Addendum)
Unfortunately, being off medicines does bring up the concerns for further resistance. I did again discuss with him the mechanism of resistance. I will recheck his labs in about 2 weeks to assure that it is improving and suppressing his viral load. He will then return 1-2 weeks after his labs. I did stress the need to keep his medications in a safe place where he will not lose some more accessible somebody did steal them. More than 40 minutes spent with face to face contact, exam, coordination of care and counseling of medicines.

## 2012-05-07 ENCOUNTER — Encounter (HOSPITAL_COMMUNITY): Payer: Self-pay | Admitting: Emergency Medicine

## 2012-05-07 ENCOUNTER — Emergency Department (HOSPITAL_COMMUNITY): Payer: Self-pay

## 2012-05-07 ENCOUNTER — Emergency Department (HOSPITAL_COMMUNITY)
Admission: EM | Admit: 2012-05-07 | Discharge: 2012-05-07 | Disposition: A | Discharge status: Home / Self Care | Payer: Self-pay | Attending: Emergency Medicine | Admitting: Emergency Medicine

## 2012-05-07 DIAGNOSIS — R112 Nausea with vomiting, unspecified: Secondary | ICD-10-CM | POA: Insufficient documentation

## 2012-05-07 DIAGNOSIS — Z79899 Other long term (current) drug therapy: Secondary | ICD-10-CM | POA: Insufficient documentation

## 2012-05-07 DIAGNOSIS — J9801 Acute bronchospasm: Secondary | ICD-10-CM

## 2012-05-07 DIAGNOSIS — Z8739 Personal history of other diseases of the musculoskeletal system and connective tissue: Secondary | ICD-10-CM | POA: Insufficient documentation

## 2012-05-07 DIAGNOSIS — R509 Fever, unspecified: Secondary | ICD-10-CM | POA: Insufficient documentation

## 2012-05-07 DIAGNOSIS — J029 Acute pharyngitis, unspecified: Secondary | ICD-10-CM | POA: Insufficient documentation

## 2012-05-07 DIAGNOSIS — Z21 Asymptomatic human immunodeficiency virus [HIV] infection status: Secondary | ICD-10-CM | POA: Insufficient documentation

## 2012-05-07 DIAGNOSIS — J3489 Other specified disorders of nose and nasal sinuses: Secondary | ICD-10-CM | POA: Insufficient documentation

## 2012-05-07 DIAGNOSIS — J40 Bronchitis, not specified as acute or chronic: Secondary | ICD-10-CM | POA: Insufficient documentation

## 2012-05-07 HISTORY — DX: Human immunodeficiency virus (HIV) disease: B20

## 2012-05-07 MED ORDER — ALBUTEROL SULFATE HFA 108 (90 BASE) MCG/ACT IN AERS
2.0000 | INHALATION_SPRAY | Freq: Once | RESPIRATORY_TRACT | Status: AC
  Administered 2012-05-07: 2 via RESPIRATORY_TRACT
  Filled 2012-05-07: qty 6.7

## 2012-05-07 MED ORDER — AEROCHAMBER PLUS W/MASK MISC
Status: AC
  Filled 2012-05-07: qty 1

## 2012-05-07 MED ORDER — AEROCHAMBER PLUS FLO-VU LARGE MISC
1.0000 | Freq: Once | Status: DC
  Filled 2012-05-07: qty 1

## 2012-05-07 MED ORDER — AZITHROMYCIN 250 MG PO TABS
500.0000 mg | ORAL_TABLET | Freq: Once | ORAL | Status: AC
  Administered 2012-05-07: 500 mg via ORAL
  Filled 2012-05-07: qty 2

## 2012-05-07 MED ORDER — IPRATROPIUM BROMIDE 0.02 % IN SOLN
0.5000 mg | RESPIRATORY_TRACT | Status: DC
  Administered 2012-05-07: 0.5 mg via RESPIRATORY_TRACT
  Filled 2012-05-07: qty 2.5

## 2012-05-07 MED ORDER — ALBUTEROL SULFATE (5 MG/ML) 0.5% IN NEBU
2.5000 mg | INHALATION_SOLUTION | RESPIRATORY_TRACT | Status: DC
  Administered 2012-05-07: 2.5 mg via RESPIRATORY_TRACT
  Filled 2012-05-07: qty 20

## 2012-05-07 MED ORDER — AZITHROMYCIN 250 MG PO TABS: ORAL_TABLET | ORAL | Status: DC

## 2012-05-07 NOTE — ED Notes (Signed)
Has had n/d and has broken out in sweat

## 2012-05-07 NOTE — ED Provider Notes (Signed)
History     CSN: [id number]  Arrival date & time 05/07/12  1012   First MD Initiated Contact with Patient 05/07/12 1017      No chief complaint on file.   (Consider location/radiation/quality/duration/timing/severity/associated sxs/prior treatment) HPI Comments: 36 year old male presents to the emergency department complaining of cough for the past 10 days. The cough is worsening over the past few days with productive yellow phlegm. Admits to associated shortness of breath, fever and chills. He has not taken his temperature. States he is nauseated and has vomited twice over the past 10 days. His cough is causing him to have a sore throat. States he feels congested. Denies chest pain or tightness, abdominal pain, lightheadedness or dizziness. No bowel changes. He tried taking NyQuil at night to help sleep, however he is still waking up coughing.  The history is provided by the patient.    Past Medical History  Diagnosis Date  . Chest pain   . HIV (human immunodeficiency virus infection)     No past surgical history on file.  No family history on file.  History  Substance Use Topics  . Smoking status: Never Smoker   . Smokeless tobacco: Never Used  . Alcohol Use: Yes     Comment: occasional      Review of Systems  Constitutional: Positive for fever and chills.  HENT: Positive for sore throat. Negative for trouble swallowing.   Eyes: Negative.   Respiratory: Positive for cough and shortness of breath. Negative for chest tightness.   Cardiovascular: Negative for chest pain.  Gastrointestinal: Positive for nausea and vomiting.  Genitourinary: Negative.   Musculoskeletal: Negative.   Skin: Negative.   Neurological: Negative.   Psychiatric/Behavioral: Negative.     Allergies  Review of patient's allergies indicates no known allergies.  Home Medications   Current Outpatient Rx  Name  Route  Sig  Dispense  Refill  . DARUNAVIR ETHANOLATE 800 MG PO TABS   Oral  Take 800 mg by mouth daily with breakfast.         . DOLUTEGRAVIR SODIUM 50 MG PO TABS   Oral   Take 50 mg by mouth daily.         Marland Kitchen EMTRICITABINE-TENOFOVIR 200-300 MG PO TABS   Oral   Take 1 tablet by mouth daily.         Marland Kitchen RITONAVIR 100 MG PO TABS   Oral   Take 100 mg by mouth daily.           BP 129/78  Pulse 63  Temp 98.1 F (36.7 C) (Oral)  Resp 16  SpO2 98%  Physical Exam  Nursing note and vitals reviewed. Constitutional: He is oriented to person, place, and time. He appears well-developed and well-nourished. No distress.  HENT:  Head: Normocephalic and atraumatic.  Mouth/Throat: Posterior oropharyngeal erythema present. No oropharyngeal exudate or posterior oropharyngeal edema.  Eyes: Conjunctivae normal are normal.  Neck: Normal range of motion. Neck supple.  Cardiovascular: Normal rate, regular rhythm and normal heart sounds.   Pulmonary/Chest: Effort normal. No respiratory distress. He has rhonchi (mild in RLF).  Abdominal: Soft. Bowel sounds are normal. There is no tenderness.  Musculoskeletal: Normal range of motion. He exhibits no edema.  Lymphadenopathy:    He has no cervical adenopathy.  Neurological: He is alert and oriented to person, place, and time.  Skin: Skin is warm. He is diaphoretic.  Psychiatric: He has a normal mood and affect. His behavior is normal.    ED  Course  Procedures (including critical care time)  Labs Reviewed - No data to display Dg Chest 2 View  05/07/2012  *RADIOLOGY REPORT*  Clinical Data: Cough, shortness of breath  CHEST - 2 VIEW  Comparison: CT chest dated 01/14/2011  Findings: Lungs are clear. No pleural effusion or pneumothorax.  Cardiomediastinal silhouette is within normal limits.  Visualized osseous structures are within normal limits.  IMPRESSION: No evidence of acute cardiopulmonary disease.   Original Report Authenticated By: Charline Bills, M.D.      1. Bronchitis   2. Bronchospasm       MDM  36  y/o male with bronchitis. Duo-neb treatment given. Symptoms slightly improved with treatment. Patient still coughing up phlegm. Will treat for bronchitis with azithromycin and also give albuterol inhaler. Patient is in NAD with normal vital signs. Stable for discharge.        Trevor Mace, PA-C 05/07/12 1207

## 2012-05-07 NOTE — ED Notes (Signed)
Dry cough occ  Brings up  Yellow sputum x a couple of weeks ~ 10 days  Has had a fever hurts chest to cough is real congested

## 2012-05-08 NOTE — Progress Notes (Signed)
  Subjective:    Patient ID: George Simon, male    DOB: 12-09-1975, 36 y.o.   MRN: [id number]  HPI Duplicate note   Review of Systems     Objective:   Physical Exam        Assessment & Plan:

## 2012-05-08 NOTE — ED Provider Notes (Signed)
Medical screening examination/treatment/procedure(s) were performed by non-physician practitioner and as supervising physician I was immediately available for consultation/collaboration.  Raeford Razor, MD 05/08/12 [phone]

## 2012-05-11 ENCOUNTER — Emergency Department (HOSPITAL_COMMUNITY)
Admission: EM | Admit: 2012-05-11 | Discharge: 2012-05-11 | Disposition: A | Discharge status: Home / Self Care | Payer: Self-pay | Attending: Emergency Medicine | Admitting: Emergency Medicine

## 2012-05-11 ENCOUNTER — Encounter (HOSPITAL_COMMUNITY): Payer: Self-pay | Admitting: *Deleted

## 2012-05-11 ENCOUNTER — Emergency Department (HOSPITAL_COMMUNITY): Payer: Self-pay

## 2012-05-11 DIAGNOSIS — R05 Cough: Secondary | ICD-10-CM | POA: Insufficient documentation

## 2012-05-11 DIAGNOSIS — B20 Human immunodeficiency virus [HIV] disease: Secondary | ICD-10-CM | POA: Insufficient documentation

## 2012-05-11 DIAGNOSIS — R059 Cough, unspecified: Secondary | ICD-10-CM | POA: Insufficient documentation

## 2012-05-11 DIAGNOSIS — J40 Bronchitis, not specified as acute or chronic: Secondary | ICD-10-CM | POA: Insufficient documentation

## 2012-05-11 DIAGNOSIS — J42 Unspecified chronic bronchitis: Secondary | ICD-10-CM

## 2012-05-11 LAB — CBC WITH DIFFERENTIAL/PLATELET
HCT: 35 % — ABNORMAL LOW (ref 39.0–52.0)
Hemoglobin: 11.6 g/dL — ABNORMAL LOW (ref 13.0–17.0)
Lymphs Abs: 3 10*3/uL (ref 0.7–4.0)
MCH: 28.7 pg (ref 26.0–34.0)
Monocytes Absolute: 0.4 10*3/uL (ref 0.1–1.0)
Monocytes Relative: 9 % (ref 3–12)
Neutro Abs: 1.2 10*3/uL — ABNORMAL LOW (ref 1.7–7.7)
Neutrophils Relative %: 26 % — ABNORMAL LOW (ref 43–77)
RBC: 4.04 MIL/uL — ABNORMAL LOW (ref 4.22–5.81)

## 2012-05-11 MED ORDER — HYDROCODONE-HOMATROPINE 5-1.5 MG/5ML PO SYRP: 5.0000 mL | ORAL_SOLUTION | Freq: Four times a day (QID) | ORAL | Status: DC | PRN

## 2012-05-11 MED ORDER — IPRATROPIUM-ALBUTEROL 20-100 MCG/ACT IN AERS: 1.0000 | INHALATION_SPRAY | Freq: Four times a day (QID) | RESPIRATORY_TRACT | Status: AC

## 2012-05-11 NOTE — ED Notes (Signed)
Pt ambulatory in hallway with RN. Stats remained 100% on RA. No coughing or sob.

## 2012-05-11 NOTE — ED Provider Notes (Signed)
History     CSN: [id number]  Arrival date & time 05/11/12  4098   First MD Initiated Contact with Patient 05/11/12 2009      Chief Complaint  Patient presents with  . Shortness of Breath  . Cough    (Consider location/radiation/quality/duration/timing/severity/associated sxs/prior treatment) HPI Comments: 36 year old male with a history of HIV (last CD4 count greater than 200 and currently compliant on medications, followed by Dr. Effie Shy) presents emergency department complaining of chronic cough.  Patient states that he has been suffering from this cough for over a year with symptoms lasting constantly for spans of 1-3 months.  Patient reports productive green and yellow sputum.  Associated symptoms includes post passive emesis and chest wall tenderness. Patient was evaluated at this facility 4 days ago and given an albuterol inhaler and Zithromax which he has completed without any relief.  Patient denies current shortness of breath, dyspnea on exertion, chest pain, fevers, night sweats, abdominal pain.  The history is provided by the patient.    Past Medical History  Diagnosis Date  . Chest pain   . HIV (human immunodeficiency virus infection)     History reviewed. No pertinent past surgical history.  No family history on file.  History  Substance Use Topics  . Smoking status: Never Smoker   . Smokeless tobacco: Never Used  . Alcohol Use: Yes     Comment: occasional      Review of Systems  Constitutional: Negative for fever, chills and appetite change.  HENT: Negative for congestion, sore throat, drooling, mouth sores, trouble swallowing, neck pain, neck stiffness and voice change.   Eyes: Negative for visual disturbance.  Respiratory: Positive for cough, chest tightness and shortness of breath (while coughing). Negative for wheezing.   Cardiovascular: Negative for chest pain and leg swelling.  Gastrointestinal: Negative for abdominal pain.  Genitourinary: Negative  for dysuria, urgency and frequency.  Neurological: Negative for dizziness, syncope, weakness, light-headedness, numbness and headaches.  Psychiatric/Behavioral: Negative for confusion.  All other systems reviewed and are negative.    Allergies  Gluten meal  Home Medications   Current Outpatient Rx  Name  Route  Sig  Dispense  Refill  . AZITHROMYCIN 250 MG PO TABS      2 po day one, then 1 daily x 4 days   5 tablet   0   . DARUNAVIR ETHANOLATE 800 MG PO TABS   Oral   Take 800 mg by mouth daily with breakfast.         . DOLUTEGRAVIR SODIUM 50 MG PO TABS   Oral   Take 50 mg by mouth daily.         Marland Kitchen EMTRICITABINE-TENOFOVIR 200-300 MG PO TABS   Oral   Take 1 tablet by mouth daily.         Marland Kitchen RITONAVIR 100 MG PO TABS   Oral   Take 100 mg by mouth daily.           BP 105/65  Pulse 55  Temp 98 F (36.7 C) (Oral)  Resp 14  SpO2 100%  Physical Exam  Constitutional: He is oriented to person, place, and time. He appears well-developed and well-nourished. No distress.  HENT:  Head: Normocephalic and atraumatic.  Mouth/Throat: Oropharynx is clear and moist. No oropharyngeal exudate.  Eyes: Conjunctivae normal and EOM are normal. Pupils are equal, round, and reactive to light. No scleral icterus.  Neck: Normal range of motion. Neck supple. No tracheal deviation present. No thyromegaly present.  Cardiovascular: Normal rate, regular rhythm, normal heart sounds and intact distal pulses.   Pulmonary/Chest: Effort normal and breath sounds normal. No stridor. No respiratory distress. He has no wheezes.  Abdominal: Soft.  Musculoskeletal: Normal range of motion. He exhibits no edema and no tenderness.  Neurological: He is alert and oriented to person, place, and time. Coordination normal.  Skin: Skin is warm and dry. No rash noted. He is not diaphoretic. No erythema. No pallor.  Psychiatric: He has a normal mood and affect. His behavior is normal.    ED Course    Procedures (including critical care time)  Labs Reviewed  CBC WITH DIFFERENTIAL - Abnormal; Notable for the following:    RBC 4.04 (*)     Hemoglobin 11.6 (*)     HCT 35.0 (*)     Neutrophils Relative 26 (*)     Neutro Abs 1.2 (*)     Lymphocytes Relative 62 (*)     All other components within normal limits   Dg Chest 2 View  05/11/2012  *RADIOLOGY REPORT*  Clinical Data: Shortness of breath, cough and bronchitis.  CHEST - 2 VIEW  Comparison: 05/07/2012  Findings: Mild atelectasis present at the right lung base.  No evidence of focal pulmonary consolidation, edema or pleural fluid. The heart size is stable and within normal limits.  IMPRESSION: Mild atelectasis at right lung base.  No focal infiltrate identified.   Original Report Authenticated By: Irish Lack, M.D.      No diagnosis found.  Patient ambulated in the emergency department with pulse ox at 100% on room air.  MDM  Chronic bronchitis  36 year old male with a history of HIV presents to the emergency department complaining of chronic cough.  He was seen in this facility 4 days ago and treated with Augmentin and albuterol inhaler which have not alleviated cough.  Patient reports that his HIV is closely being followed by Dr. Effie Shy with "good CD4 counts" as well as medication compliance.  Throughout hospital scores patient is nontoxic or ill appearing in no acute distress with normal vital signs.  There is no evidence of hypoxia or severe bronchospasm.  Patient will be discharged with Atrovent and antitussive agent as well as recommendations to followup with private primary care physician to followup on his chronic bronchitis.  Patient is agreeable with plan and reports that he is currently not satisfied with his PCP in requests a resource guide.        Jaci Carrel, [place] 05/11/12 2113

## 2012-05-11 NOTE — ED Notes (Signed)
Here past Monday for bronchitis; received inhaler and zithromax; finished the zithromax. Been vomiting. Productive green/yellow. Intermittent chest wall pain when coughing.

## 2012-05-12 NOTE — ED Provider Notes (Signed)
Medical screening examination/treatment/procedure(s) were performed by non-physician practitioner and as supervising physician I was immediately available for consultation/collaboration.   Carleene Cooper III, MD 05/12/12 1022

## 2012-05-14 MED ORDER — ALBUTEROL SULFATE HFA 108 (90 BASE) MCG/ACT IN AERS: 2.0000 | INHALATION_SPRAY | RESPIRATORY_TRACT | Status: AC | PRN

## 2012-05-31 ENCOUNTER — Telehealth: Payer: Self-pay

## 2012-05-31 ENCOUNTER — Other Ambulatory Visit (INDEPENDENT_AMBULATORY_CARE_PROVIDER_SITE_OTHER): Payer: No Typology Code available for payment source

## 2012-05-31 DIAGNOSIS — B20 Human immunodeficiency virus [HIV] disease: Secondary | ICD-10-CM

## 2012-05-31 LAB — CBC WITH DIFFERENTIAL/PLATELET
Basophils Absolute: 0 10*3/uL (ref 0.0–0.1)
HCT: 40.2 % (ref 39.0–52.0)
Hemoglobin: 12 g/dL — ABNORMAL LOW (ref 13.0–17.0)
Lymphocytes Relative: 67 % — ABNORMAL HIGH (ref 12–46)
Lymphs Abs: 2.9 10*3/uL (ref 0.7–4.0)
MCV: 95.9 fL (ref 78.0–100.0)
Monocytes Absolute: 0.4 10*3/uL (ref 0.1–1.0)
Monocytes Relative: 9 % (ref 3–12)
Neutro Abs: 0.9 10*3/uL — ABNORMAL LOW (ref 1.7–7.7)
WBC: 4.4 10*3/uL (ref 4.0–10.5)

## 2012-05-31 LAB — COMPLETE METABOLIC PANEL WITH GFR
AST: 17 U/L (ref 0–37)
Albumin: 3.9 g/dL (ref 3.5–5.2)
BUN: 11 mg/dL (ref 6–23)
CO2: 26 mEq/L (ref 19–32)
Calcium: 8.8 mg/dL (ref 8.4–10.5)
Chloride: 101 mEq/L (ref 96–112)
GFR, Est African American: >89 mL/min
Glucose, Bld: 87 mg/dL (ref 70–99)
Potassium: 3.9 mEq/L (ref 3.5–5.3)

## 2012-06-01 LAB — HIV-1 RNA ULTRAQUANT REFLEX TO GENTYP+: HIV-1 RNA Quant, Log: <1.3 {Log} (ref <1.30)

## 2012-06-01 LAB — T-HELPER CELL (CD4) - (RCID CLINIC ONLY): CD4 T Cell Abs: 520 uL (ref 400–2700)

## 2012-06-21 ENCOUNTER — Ambulatory Visit (INDEPENDENT_AMBULATORY_CARE_PROVIDER_SITE_OTHER): Payer: No Typology Code available for payment source | Admitting: Internal Medicine

## 2012-06-21 ENCOUNTER — Encounter: Payer: Self-pay | Admitting: Internal Medicine

## 2012-06-21 VITALS — BP 131/86 | HR 68 | Temp 98.2°F | Ht 71.0 in | Wt 175.0 lb

## 2012-06-21 DIAGNOSIS — J069 Acute upper respiratory infection, unspecified: Secondary | ICD-10-CM | POA: Insufficient documentation

## 2012-06-21 DIAGNOSIS — B20 Human immunodeficiency virus [HIV] disease: Secondary | ICD-10-CM

## 2012-06-21 MED ORDER — ALBUTEROL SULFATE HFA 108 (90 BASE) MCG/ACT IN AERS: 2.0000 | INHALATION_SPRAY | Freq: Four times a day (QID) | RESPIRATORY_TRACT | Status: AC | PRN

## 2012-06-21 MED ORDER — LORATADINE 10 MG PO TABS: 10.0000 mg | ORAL_TABLET | Freq: Every day | ORAL | Status: AC

## 2012-06-21 NOTE — Progress Notes (Signed)
  Subjective:    Patient ID: George Simon, male    DOB: [birth year], 37 y.o.   MRN: [id number]  HPI He comes in for folow up of his HIV.  He started on Stribild initially but resistance testing from [facility] revealed significant mutations and he was switched to daily Tivicay, Prezista, norvir and Truvada.  He reports excellent compliance and no missed doses.  He is now undetectable.     He also has been seen in ED for URI symtpoms.  It has been ongoing since about November.  He reports significant post tussive emesis, congestion and dry cough.  No sick contacts.  He did have fever initially, though no fever recently.  He does endorse some SOB.  CXR did show some mild atelectasis.  He did take a course of azithromycin.  He does not smoke, no history of asthma.     Review of Systems  Constitutional: Negative for fever, chills, fatigue and unexpected weight change.  HENT: Positive for congestion, sore throat and postnasal drip. Negative for trouble swallowing and sinus pressure.   Respiratory: Positive for cough, shortness of breath and wheezing.   Gastrointestinal: Negative for nausea, abdominal pain and diarrhea.  Skin: Negative for rash.  Neurological: Negative for dizziness and headaches.       Objective:   Physical Exam  Constitutional: He appears well-developed and well-nourished. No distress.  HENT:  Mouth/Throat: Oropharynx is clear and moist. No oropharyngeal exudate.  Cardiovascular: Normal rate, regular rhythm and normal heart sounds.  Exam reveals no gallop and no friction rub.   No murmur heard. Pulmonary/Chest: Effort normal and breath sounds normal. No respiratory distress. He has no wheezes. He has no rales.  Abdominal: Soft. Bowel sounds are normal. He exhibits no distension. There is no tenderness. There is no rebound.  Skin: No rash noted.          Assessment & Plan:

## 2012-06-21 NOTE — Assessment & Plan Note (Signed)
Symptoms concerning for URI.  CXR has not shown any pneumonia and no fever currently.  Also could be allergy or asthma with URI on top.  I have given him an albuterol inhaler and advised him to try claritin or OTC allergy medicine and benadryl at night.    RTC next week.

## 2012-06-21 NOTE — Telephone Encounter (Signed)
error 

## 2012-06-21 NOTE — Assessment & Plan Note (Signed)
He is doing well and will continue with his current ARVs.

## 2012-06-28 ENCOUNTER — Ambulatory Visit (INDEPENDENT_AMBULATORY_CARE_PROVIDER_SITE_OTHER): Payer: No Typology Code available for payment source | Admitting: Internal Medicine

## 2012-06-28 ENCOUNTER — Telehealth: Payer: Self-pay | Admitting: *Deleted

## 2012-06-28 ENCOUNTER — Ambulatory Visit: Payer: No Typology Code available for payment source | Admitting: Internal Medicine

## 2012-06-28 ENCOUNTER — Encounter: Payer: Self-pay | Admitting: Internal Medicine

## 2012-06-28 VITALS — BP 105/67 | HR 86 | Temp 98.0°F | Wt 174.0 lb

## 2012-06-28 DIAGNOSIS — Z21 Asymptomatic human immunodeficiency virus [HIV] infection status: Secondary | ICD-10-CM

## 2012-06-28 DIAGNOSIS — R112 Nausea with vomiting, unspecified: Secondary | ICD-10-CM

## 2012-06-28 DIAGNOSIS — B20 Human immunodeficiency virus [HIV] disease: Secondary | ICD-10-CM

## 2012-06-28 DIAGNOSIS — F329 Major depressive disorder, single episode, unspecified: Secondary | ICD-10-CM

## 2012-06-28 MED ORDER — PROMETHAZINE HCL 25 MG PO TABS: 25.0000 mg | ORAL_TABLET | Freq: Three times a day (TID) | ORAL | Status: DC | PRN

## 2012-06-28 NOTE — Telephone Encounter (Signed)
Called patient regarding his no show appt for this AM, and he requested to be seen due to vomiting. Appt scheduled for today with Dr. Drue Second. Wendall Mola

## 2012-06-28 NOTE — Progress Notes (Signed)
HIV CLINIC NOTE  RFV: sick visit Subjective:    Patient ID: George Simon, male    DOB: [birth year], 37 y.o.   MRN: [id number]  HPI 37yo Male with HIV, CD 4 count 520/ VL <20, on dolutegravir/truvada/boosted darunavir.   He has been feeling ill for the past week, was in on 06/21/2012 seen by Dr. Luciana Axe, thought to be URVI. He is now feeling more N & V for the past 4 days. A week ago he would have post-tussis emesis. Feeling weak. Drinking juice and water. Urine is slightly dark. Unable to eat much since he quickly has nausea/vomiting and occasionally diarrhea. Unclear if he has vomitted up any of his meds. At nighttime, he has noticed having chills and nightsweats. But no fevers.already missed 4 days of work, at [employer].    Current Outpatient Prescriptions on File Prior to Visit  Medication Sig Dispense Refill  . Darunavir Ethanolate (PREZISTA) 800 MG tablet Take 800 mg by mouth daily with breakfast.      . Dolutegravir Sodium 50 MG TABS Take 50 mg by mouth daily.      Marland Kitchen emtricitabine-tenofovir (TRUVADA) 200-300 MG per tablet Take 1 tablet by mouth daily.      Marland Kitchen loratadine (CLARITIN) 10 MG tablet Take 1 tablet (10 mg total) by mouth daily.  30 tablet  6  . ritonavir (NORVIR) 100 MG TABS Take 100 mg by mouth daily.      Marland Kitchen albuterol (PROVENTIL HFA;VENTOLIN HFA) 108 (90 BASE) MCG/ACT inhaler Inhale 2 puffs into the lungs every 4 (four) hours as needed for wheezing.  1 Inhaler  2  . albuterol (PROVENTIL HFA;VENTOLIN HFA) 108 (90 BASE) MCG/ACT inhaler Inhale 2 puffs into the lungs every 6 (six) hours as needed for wheezing.  1 Inhaler  6  . Ipratropium-Albuterol (COMBIVENT RESPIMAT) 20-100 MCG/ACT AERS respimat Inhale 1 puff into the lungs every 6 (six) hours.  1 Inhaler  1   Active Ambulatory Problems    Diagnosis Date Noted  . HIV INFECTION 04/20/2010  . Anemia, unspecified 04/20/2010  . IBS 04/20/2010  . DYSTHYMIA, SITUATIONAL 05/10/2010  . 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  DRUG ABUSE 05/10/2010  . Acute URI 06/21/2012   Resolved Ambulatory Problems    Diagnosis Date Noted  . HEMATURIA UNSPECIFIED 04/22/2010  . OTHER NONSPECIFIC FINDINGS EXAMINATION OF BLOOD 04/22/2010  . Chest pain    Past Medical History  Diagnosis Date  . HIV (human immunodeficiency virus infection)      Social hx: broke up with his partner  Review of Systems 10 point ROS negative except what is mentioned in hpi    Objective:   Physical Exam BP 105/67  Pulse 86  Temp 98 F (36.7 C)  Wt 174 lb (78.926 kg) Physical Exam  Constitutional: He is oriented to person, place, and time. He appears well-developed and well-nourished. No distress.  HENT:  Mouth/Throat: Oropharynx is clear and moist. No oropharyngeal exudate.  Cardiovascular: Normal rate, regular rhythm and normal heart sounds. Exam reveals no gallop and no friction rub.  No murmur heard.  Pulmonary/Chest: Effort normal and breath sounds normal. No respiratory distress. He has no wheezes.  Abdominal: Soft. Bowel sounds are normal. He exhibits no distension. There is no tenderness.  Lymphadenopathy:  He has no cervical adenopathy.  Neurological: He is alert and oriented to person, place, and time.  Skin: Skin is warm and dry. No rash noted. No erythema.  Psychiatric: He has a normal mood and affect. His behavior  is normal.      Assessment & Plan:  HIV= continue on salvage regimen of tivicay/truvada/DRVr.  Nausea and vomiting =  Likely respiratory-GI virus. Will give phenergan 25 mg Q8hr PRN. Stay hydrated.and will give work note from Sunday thru 1/22nd. 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 He has appt scheduled with Bernette Redbird at the end of the month. Will check in again at next visit if there is need for anti-depressant vs. Counseling alone.  rtc to follow up with regular appointment with Dr. Luciana Axe

## 2012-07-10 ENCOUNTER — Ambulatory Visit: Payer: No Typology Code available for payment source

## 2012-09-05 ENCOUNTER — Other Ambulatory Visit: Payer: Self-pay

## 2012-09-17 ENCOUNTER — Ambulatory Visit: Payer: Self-pay | Admitting: Internal Medicine

## 2012-09-17 ENCOUNTER — Telehealth: Payer: Self-pay

## 2012-09-17 ENCOUNTER — Ambulatory Visit: Payer: Self-pay

## 2012-09-17 NOTE — Telephone Encounter (Signed)
Patient in [place], no showed today's appointment.  Patient notified that he missed his last labs, RW/ADAP renewal, and f/u OV with Dr. Luciana Axe.  Patient stated that he was about to get in the shower and would "call back, maybe tomorrow" to reschedule for when he is in town. Andree Coss, RN

## 2013-06-08 IMAGING — CR DG CHEST 2V
2 series · 2 of 2 positions shown · non-contrast
Comparison: 05/07/2012

CLINICAL DATA: Shortness of breath, cough and bronchitis.

CHEST - 2 VIEW

[w chest pa]
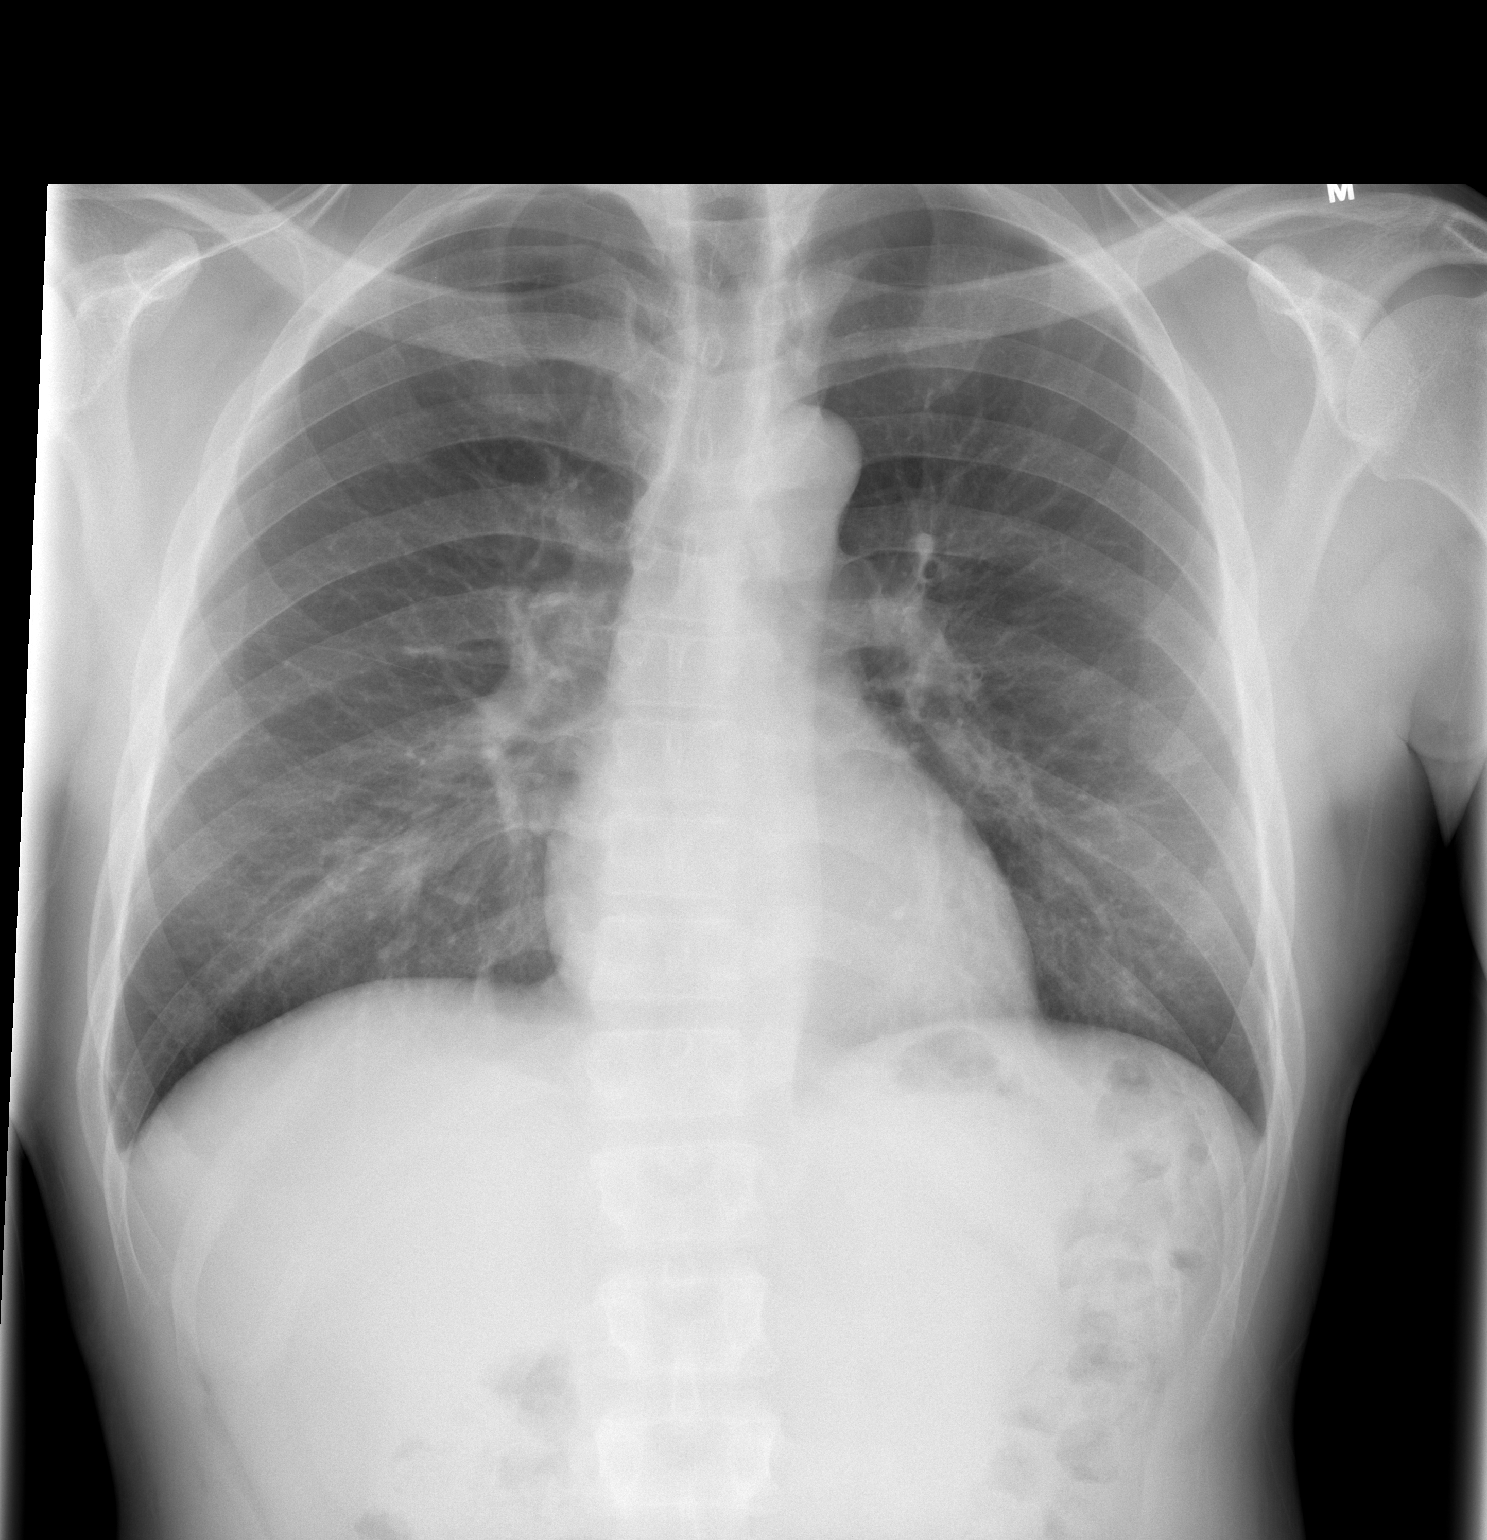

[w chest lat]
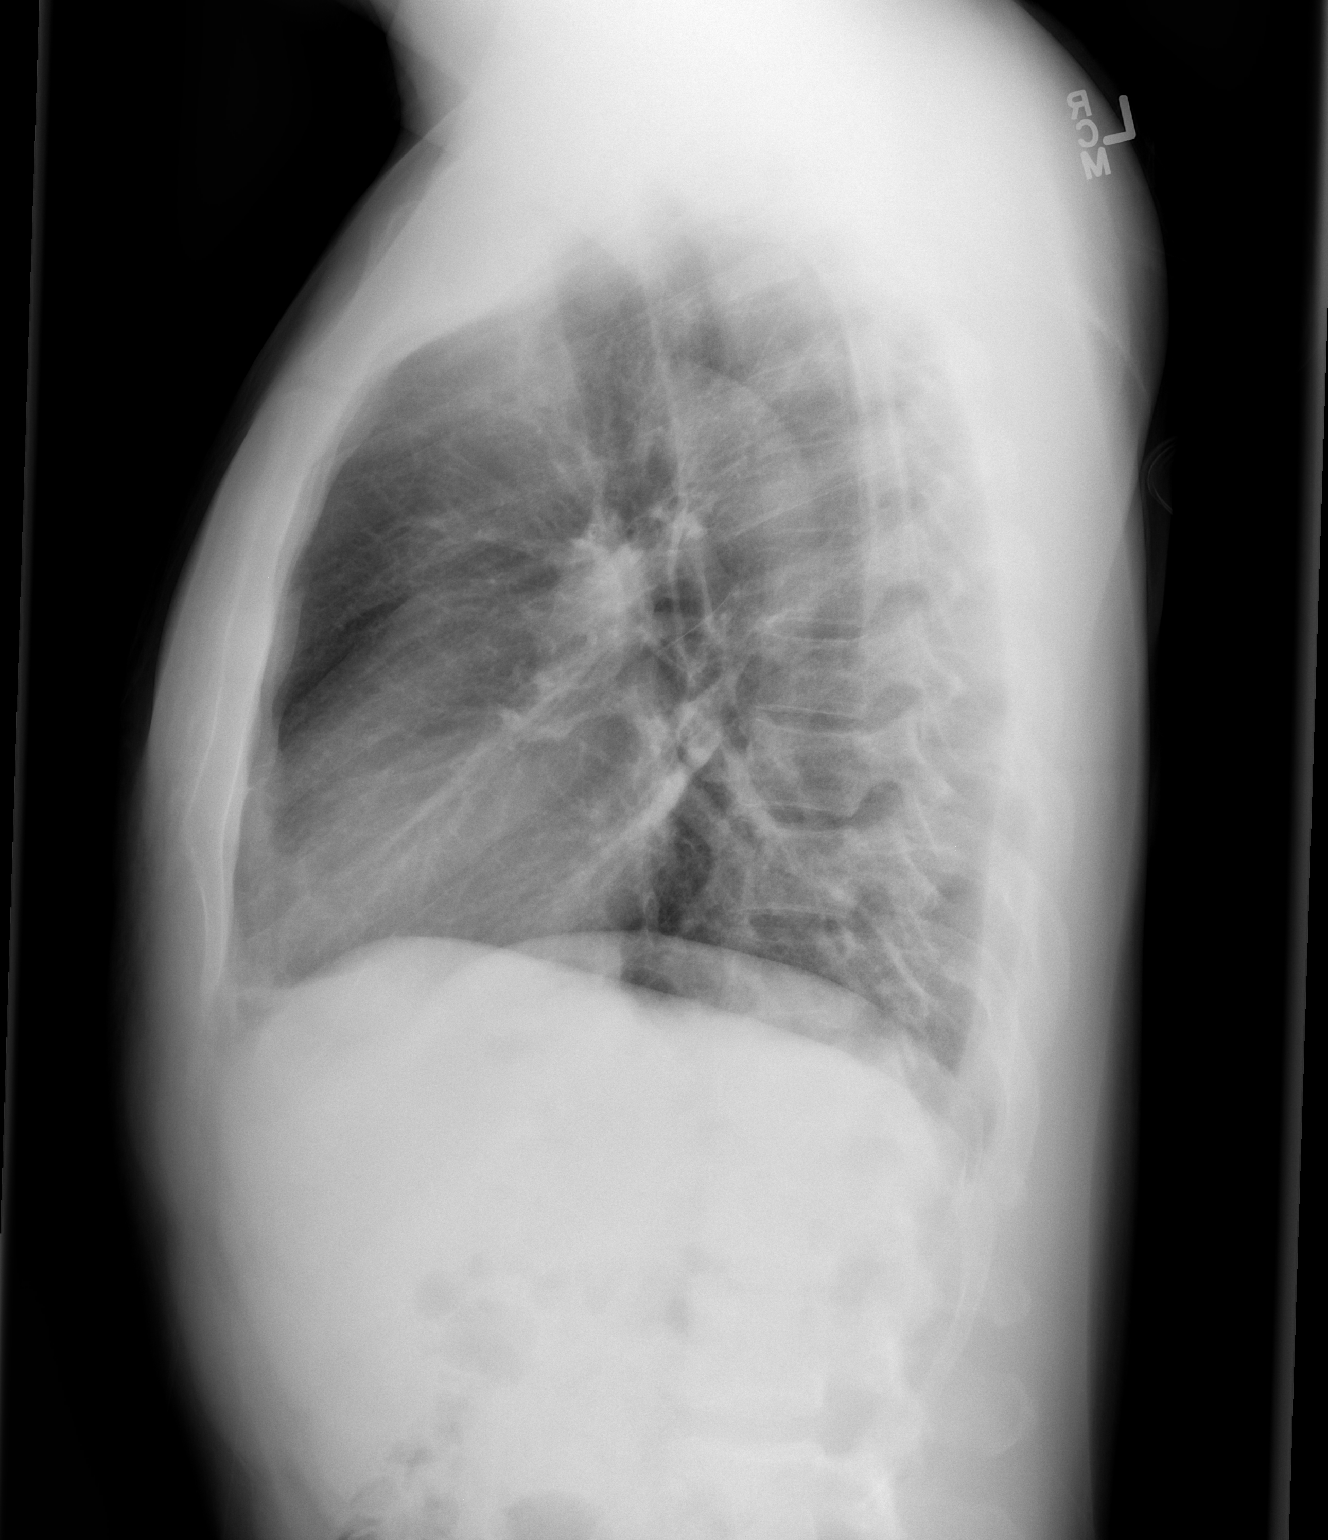

[2 of 2 positions shown; findings below may reference images not displayed]

FINDINGS: Mild atelectasis present at the right lung base.  No
evidence of focal pulmonary consolidation, edema or pleural fluid.
The heart size is stable and within normal limits.
IMPRESSION: Mild atelectasis at right lung base.  No focal infiltrate
identified.

## 2015-05-20 ENCOUNTER — Encounter (HOSPITAL_COMMUNITY): Payer: Self-pay

## 2015-05-20 ENCOUNTER — Emergency Department (HOSPITAL_COMMUNITY)
Admit: 2015-05-20 | Discharge: 2015-05-20 | Disposition: A | Discharge status: Home / Self Care | Payer: Self-pay | Attending: Emergency Medicine | Admitting: Emergency Medicine

## 2015-05-20 ENCOUNTER — Emergency Department (HOSPITAL_COMMUNITY)
Admission: EM | Admit: 2015-05-20 | Discharge: 2015-05-20 | Discharge status: Against Medical Advice | Payer: Self-pay | Attending: Emergency Medicine | Admitting: Emergency Medicine

## 2015-05-20 DIAGNOSIS — B20 Human immunodeficiency virus [HIV] disease: Secondary | ICD-10-CM | POA: Insufficient documentation

## 2015-05-20 DIAGNOSIS — M79605 Pain in left leg: Secondary | ICD-10-CM

## 2015-05-20 DIAGNOSIS — L03116 Cellulitis of left lower limb: Secondary | ICD-10-CM | POA: Insufficient documentation

## 2015-05-20 DIAGNOSIS — Z79899 Other long term (current) drug therapy: Secondary | ICD-10-CM | POA: Insufficient documentation

## 2015-05-20 LAB — CBC WITH DIFFERENTIAL/PLATELET
BASOS ABS: 0 10*3/uL (ref 0.0–0.1)
BASOS PCT: 0 %
EOS ABS: 0 10*3/uL (ref 0.0–0.7)
EOS PCT: 0 %
HCT: 39.9 % (ref 39.0–52.0)
Hemoglobin: 13.2 g/dL (ref 13.0–17.0)
Lymphocytes Relative: 35 %
Lymphs Abs: 2.1 10*3/uL (ref 0.7–4.0)
MCH: 28.6 pg (ref 26.0–34.0)
MCHC: 33.1 g/dL (ref 30.0–36.0)
MCV: 86.6 fL (ref 78.0–100.0)
MONO ABS: 0.1 10*3/uL (ref 0.1–1.0)
Monocytes Relative: 2 %
Neutro Abs: 3.8 10*3/uL (ref 1.7–7.7)
Neutrophils Relative %: 63 %
PLATELETS: 248 10*3/uL (ref 150–400)
RBC: 4.61 MIL/uL (ref 4.22–5.81)
RDW: 13.8 % (ref 11.5–15.5)
WBC: 6 10*3/uL (ref 4.0–10.5)

## 2015-05-20 LAB — BASIC METABOLIC PANEL
ANION GAP: 8 (ref 5–15)
BUN: 13 mg/dL (ref 6–20)
CALCIUM: 9 mg/dL (ref 8.9–10.3)
CO2: 25 mmol/L (ref 22–32)
Chloride: 103 mmol/L (ref 101–111)
Creatinine, Ser: 1.07 mg/dL (ref 0.61–1.24)
GLUCOSE: 102 mg/dL — AB (ref 65–99)
Potassium: 3.5 mmol/L (ref 3.5–5.1)
SODIUM: 136 mmol/L (ref 135–145)

## 2015-05-20 MED ORDER — METHOCARBAMOL 500 MG PO TABS
500.0000 mg | ORAL_TABLET | Freq: Once | ORAL | Status: AC
  Administered 2015-05-20: 500 mg via ORAL
  Filled 2015-05-20: qty 1

## 2015-05-20 MED ORDER — IBUPROFEN 800 MG PO TABS
800.0000 mg | ORAL_TABLET | Freq: Once | ORAL | Status: AC
  Administered 2015-05-20: 800 mg via ORAL
  Filled 2015-05-20: qty 1

## 2015-05-20 MED ORDER — CLINDAMYCIN HCL 150 MG PO CAPS: 300.0000 mg | ORAL_CAPSULE | Freq: Three times a day (TID) | ORAL | Status: AC

## 2015-05-20 MED ORDER — CLINDAMYCIN HCL 150 MG PO CAPS: 300.0000 mg | ORAL_CAPSULE | Freq: Three times a day (TID) | ORAL | Status: DC

## 2015-05-20 NOTE — ED Notes (Addendum)
Pt presents via EMS with c/o sudden onset of left leg pain from his hip to his knee. Pt reports that his pain is sharp in nature, is screaming very loudly upon EMS arrival. Per EMS, PMS intact. Pt reports that he was told a year ago that he had a PE and then when he returned to the doctor, he was informed that he was misdiagnosed. EMS reports that only other hx reported was HIV positive.

## 2015-05-20 NOTE — Progress Notes (Addendum)
Pt confirms seeing Dr Luciana Axeomer  [place] but prefers [place] the [place] ("friend's address) as listed in EPIC Pt given a list of [place]cp resources Prefers not to change address in E:PIC  Added to d/c instructions Please use the resources provided to you in emergency room by case manager to assist with doctor for follow up As needed These [place] uninsured resources provide possible primary care providers, resources for discounted medications, housing, dental resources, affordable care act information, plus other resources for [place]

## 2015-05-20 NOTE — ED Notes (Signed)
George Simon, our P.A. Is aware of pt. Pulse.  He now denies any pain or discomfort.  He has a strange, pensive demeanor.

## 2015-05-20 NOTE — Progress Notes (Signed)
VASCULAR LAB PRELIMINARY  PRELIMINARY  PRELIMINARY  PRELIMINARY  Left lower extremity venous duplex attempted.    Patient refused test.  Lunabella Badgett, RVT 05/20/2015, 1:55 PM

## 2015-05-20 NOTE — ED Provider Notes (Signed)
Pt seen and evaluated.  Pt with low grade temp.  Induration, erythema, tenderness, and increased circumference to right upper leg.  Normal perfusion.  Pt refuses doppler.  I discussed possibility of DVT leading to PE and death without diagnosis. He still declines. My first impression is that this is likely cellulitis. However, cannot exclude DVT in my opinion in this patient without Doppler. Plan will be empiric treatment with antibiotics for presumed cellulitis. With instructions to return should he change his mind, or worsen.  Rolland PorterMark Aylen Stradford, MD 05/20/15 1452

## 2015-05-20 NOTE — ED Provider Notes (Signed)
CSN: [id number]     Arrival date & time 05/20/15  1038 History   First MD Initiated Contact with Patient 05/20/15 1118     Chief Complaint  Patient presents with  . Leg Pain     (Consider location/radiation/quality/duration/timing/severity/associated sxs/prior Treatment) Patient is a 39 y.o. male presenting with leg pain. The history is provided by the patient and medical records.  Leg Pain   39 y.o. M with hx of HIV, presenting to the ED for left leg pain. Patient states he was walking and all of a sudden he felt as though his left leg was going to "give out". He states since this time he has had constant, throbbing pain in his left upper thigh. He states he has intermittent sharp, stabbing sensations as well.  He denies any recent fever, chills, or sweats. Patient has no history of leg injuries. He states he was told a few years ago that he had a DVT which turned into a PE, however was later told that he was misdiagnosed. Patient states he is not exactly sure what happened. He denies any recent travel, surgeries, or prolonged immobilization.  Past Medical History  Diagnosis Date  . Chest pain   . HIV (human immunodeficiency virus infection) (HCC)    History reviewed. No pertinent past surgical history. No family history on file. Social History  Substance Use Topics  . Smoking status: Never Smoker   . Smokeless tobacco: Never Used  . Alcohol Use: Yes     Comment: 1 drink (wine or margarita) 3-4 x week    Review of Systems  Musculoskeletal: Positive for arthralgias.  All other systems reviewed and are negative.     Allergies  Gluten meal  Home Medications   Prior to Admission medications   Medication Sig Start Date End Date Taking? Authorizing Provider  albuterol (PROVENTIL HFA;VENTOLIN HFA) 108 (90 BASE) MCG/ACT inhaler Inhale 2 puffs into the lungs every 4 (four) hours as needed for wheezing. Patient not taking: Reported on 05/20/2015 05/14/12   Joni Reining Pisciotta, PA-C   albuterol (PROVENTIL HFA;VENTOLIN HFA) 108 (90 BASE) MCG/ACT inhaler Inhale 2 puffs into the lungs every 6 (six) hours as needed for wheezing. Patient not taking: Reported on 05/20/2015 06/21/12   Gardiner Barefoot, MD  Darunavir Ethanolate (PREZISTA) 800 MG tablet Take 800 mg by mouth daily with breakfast. 03/12/12   Gardiner Barefoot, MD  Dolutegravir Sodium 50 MG TABS Take 50 mg by mouth daily. 03/12/12   Gardiner Barefoot, MD  emtricitabine-tenofovir (TRUVADA) 200-300 MG per tablet Take 1 tablet by mouth daily. 03/12/12   Gardiner Barefoot, MD  Ipratropium-Albuterol (COMBIVENT RESPIMAT) 20-100 MCG/ACT AERS respimat Inhale 1 puff into the lungs every 6 (six) hours. Patient not taking: Reported on 05/20/2015 05/11/12   Lisette Paz, PA-C  loratadine (CLARITIN) 10 MG tablet Take 1 tablet (10 mg total) by mouth daily. Patient not taking: Reported on 05/20/2015 06/21/12   Gardiner Barefoot, MD  ritonavir (NORVIR) 100 MG TABS Take 100 mg by mouth daily. 03/12/12   Gardiner Barefoot, MD   BP 109/73 mmHg  Pulse 84  Temp(Src) 97.4 F (36.3 C) (Oral)  Resp 18  SpO2 100%   Physical Exam  Constitutional: He is oriented to person, place, and time. He appears well-developed and well-nourished.  Uncooperative with exam  HENT:  Head: Normocephalic and atraumatic.  Mouth/Throat: Oropharynx is clear and moist.  Eyes: Conjunctivae and EOM are normal. Pupils are equal, round, and reactive to light.  Neck: Normal range of motion.  Cardiovascular: Normal rate, regular rhythm and normal heart sounds.   Pulmonary/Chest: Effort normal and breath sounds normal. No respiratory distress. He has no wheezes.  Abdominal: Soft. Bowel sounds are normal.  Musculoskeletal: Normal range of motion.  Uncooperative with exam, swatting my hand away multiple times Left leg with apparent tenderness of upper anterior and medial thigh; no appreciable inguinal hernia; there are old scars present on medial thigh, no acute injuries noted; no open wounds  or abscess; slight induration noted as well as warmth to touch; mild swelling but compartments soft, easily compressible; leg is neurovascularly intact  Neurological: He is alert and oriented to person, place, and time.  Skin: Skin is warm and dry.  Psychiatric: He has a normal mood and affect.  Nursing note and vitals reviewed.   ED Course  Procedures (including critical care time) Labs Review Labs Reviewed  BASIC METABOLIC PANEL - Abnormal; Notable for the following:    Glucose, Bld 102 (*)    All other components within normal limits  CBC WITH DIFFERENTIAL/PLATELET    Imaging Review No results found. I have personally reviewed and evaluated these images and lab results as part of my medical decision-making.   EKG Interpretation None      MDM   Final diagnoses:  Left leg pain  Cellulitis of left lower extremity   39 year old male here with left leg pain. Patient difficulty to exam and very uncooperative.  There is slight induration and warmth to touch and induration of left medial thigh.  Compartments soft, easily compressible.  There is some swelling noted as well.  Patient has questionable hx of DVT (states hx of PE but reports he was "misdiagnosed").  Will obtain basic labs and venous duplex.  Motrin and robaxin ordered.  1:19 PM After meds patient states he is feeling much better.  Awaiting venous duplex.  1:54 PM Notified by vascular that patient refused duplex.  He is now tachycardic with low grade fever.  Myself and Dr. Fayrene FearingJames have spoken with patient, he still refuses duplex study.  He understands the risk of potential DVT, PE, vascular collapse, respiratory failure, and/or death, he still refused venous duplex. Patient is non toxic in appearance at this time, WBC count normal.  Most recent CD4 count 500+ so immune competent. Tachycardia likely related to fever.  No chest pain or SOB currently.  Will start on clindamycin for presumed cellulitis given leg pain, low grade  fever, and tachycardia, however patient will sign out AMA given that he will not fully comply with work-up despite being counseled on this multiple times.  Case discussed with attending physician, Dr. Fayrene FearingJames, who evaluated aptient and agrees with assessment and plan of care.  Garlon HatchetLisa M Rayelynn Loyal, PA-C 05/20/15 1540  Rolland PorterMark James, MD 05/28/15 [phone]

## 2015-05-20 NOTE — Discharge Instructions (Signed)
Take the prescribed medication as directed.  May take tylenol or motrin as needed for fever. Follow-up with your primary care physician. Return to the ED for new or worsening symptoms.  Cellulitis Cellulitis is an infection of the skin and the tissue beneath it. The infected area is usually red and tender. Cellulitis occurs most often in the arms and lower legs.  CAUSES  Cellulitis is caused by bacteria that enter the skin through cracks or cuts in the skin. The most common types of bacteria that cause cellulitis are staphylococci and streptococci. SIGNS AND SYMPTOMS   Redness and warmth.  Swelling.  Tenderness or pain.  Fever. DIAGNOSIS  Your health care provider can usually determine what is wrong based on a physical exam. Blood tests may also be done. TREATMENT  Treatment usually involves taking an antibiotic medicine. HOME CARE INSTRUCTIONS   Take your antibiotic medicine as directed by your health care provider. Finish the antibiotic even if you start to feel better.  Keep the infected arm or leg elevated to reduce swelling.  Apply a warm cloth to the affected area up to 4 times per day to relieve pain.  Take medicines only as directed by your health care provider.  Keep all follow-up visits as directed by your health care provider. SEEK MEDICAL CARE IF:   You notice red streaks coming from the infected area.  Your red area gets larger or turns dark in color.  Your bone or joint underneath the infected area becomes painful after the skin has healed.  Your infection returns in the same area or another area.  You notice a swollen bump in the infected area.  You develop new symptoms.  You have a fever. SEEK IMMEDIATE MEDICAL CARE IF:   You feel very sleepy.  You develop vomiting or diarrhea.  You have a general ill feeling (malaise) with muscle aches and pains.   This information is not intended to replace advice given to you by your health care provider. Make  sure you discuss any questions you have with your health care provider.   Document Released: 03/09/2005 Document Revised: 02/18/2015 Document Reviewed: 08/15/2011 Elsevier Interactive Patient Education [employer]

## 2015-05-20 NOTE — ED Notes (Signed)
Bed: [id number] Expected date:  Expected time:  Means of arrival:  Comments: EMS- 39yo M, L leg pain/back pain
